# Patient Record
Sex: Male | Born: 1985 | Race: White | Hispanic: No | Marital: Single | Smoking: Current every day smoker
Health system: Southern US, Community
[De-identification: ages and names within clinical notes are randomized; demographics above are authoritative.]

## PROBLEM LIST (undated history)

## (undated) DIAGNOSIS — F909 Attention-deficit hyperactivity disorder, unspecified type: Secondary | ICD-10-CM

## (undated) DIAGNOSIS — R000 Tachycardia, unspecified: Secondary | ICD-10-CM

## (undated) DIAGNOSIS — I10 Essential (primary) hypertension: Secondary | ICD-10-CM

## (undated) DIAGNOSIS — F32A Depression, unspecified: Secondary | ICD-10-CM

## (undated) DIAGNOSIS — J309 Allergic rhinitis, unspecified: Secondary | ICD-10-CM

## (undated) DIAGNOSIS — F329 Major depressive disorder, single episode, unspecified: Secondary | ICD-10-CM

## (undated) HISTORY — DX: Essential (primary) hypertension: I10

## (undated) HISTORY — DX: Depression, unspecified: F32.A

## (undated) HISTORY — DX: Major depressive disorder, single episode, unspecified: F32.9

## (undated) HISTORY — DX: Allergic rhinitis, unspecified: J30.9

## (undated) HISTORY — PX: TONSILLECTOMY: SUR1361

---
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
## 2006-02-23 ENCOUNTER — Ambulatory Visit: Payer: Self-pay | Admitting: Pediatrics

## 2006-07-04 ENCOUNTER — Ambulatory Visit: Payer: Self-pay | Admitting: Pediatrics

## 2006-11-02 ENCOUNTER — Ambulatory Visit: Payer: Self-pay | Admitting: Pediatrics

## 2007-03-02 ENCOUNTER — Ambulatory Visit: Payer: Self-pay | Admitting: Pediatrics

## 2007-07-27 ENCOUNTER — Ambulatory Visit: Payer: Self-pay | Admitting: Pediatrics

## 2008-03-17 ENCOUNTER — Ambulatory Visit: Payer: Self-pay | Admitting: Pediatrics

## 2008-04-08 ENCOUNTER — Ambulatory Visit: Payer: Self-pay | Admitting: Pediatrics

## 2008-07-14 ENCOUNTER — Ambulatory Visit: Payer: Self-pay | Admitting: Pediatrics

## 2008-10-15 ENCOUNTER — Ambulatory Visit: Payer: Self-pay | Admitting: Pediatrics

## 2008-11-10 ENCOUNTER — Ambulatory Visit: Payer: Self-pay | Admitting: Pediatrics

## 2009-02-27 ENCOUNTER — Ambulatory Visit: Payer: Self-pay | Admitting: Pediatrics

## 2009-03-05 ENCOUNTER — Ambulatory Visit: Payer: Self-pay | Admitting: Pediatrics

## 2009-05-19 ENCOUNTER — Ambulatory Visit: Payer: Self-pay | Admitting: Pediatrics

## 2009-08-14 ENCOUNTER — Ambulatory Visit: Payer: Self-pay | Admitting: Pediatrics

## 2009-08-20 ENCOUNTER — Ambulatory Visit: Payer: Self-pay | Admitting: Pediatrics

## 2009-10-23 ENCOUNTER — Ambulatory Visit: Payer: Self-pay | Admitting: Pediatrics

## 2011-05-06 ENCOUNTER — Emergency Department (HOSPITAL_COMMUNITY)
Admission: EM | Admit: 2011-05-06 | Discharge: 2011-05-06 | Disposition: A | Discharge status: Home / Self Care | Payer: Managed Care, Other (non HMO) | Source: Home / Self Care | Attending: Emergency Medicine | Admitting: Emergency Medicine

## 2011-05-06 DIAGNOSIS — J069 Acute upper respiratory infection, unspecified: Secondary | ICD-10-CM

## 2011-05-06 DIAGNOSIS — J029 Acute pharyngitis, unspecified: Secondary | ICD-10-CM

## 2011-05-06 HISTORY — DX: Tachycardia, unspecified: R00.0

## 2011-05-06 HISTORY — DX: Attention-deficit hyperactivity disorder, unspecified type: F90.9

## 2011-05-06 MED ORDER — ACETAMINOPHEN-CODEINE #3 300-30 MG PO TABS: 1.0000 | ORAL_TABLET | Freq: Four times a day (QID) | ORAL | Status: AC | PRN

## 2011-05-06 NOTE — ED Provider Notes (Addendum)
History     CSN: [id number]  Arrival date & time 05/06/11  1417   First MD Initiated Contact with Patient 05/06/11 1529      Chief Complaint  Patient presents with  . Sore Throat  . Cough    (Consider location/radiation/quality/duration/timing/severity/associated sxs/prior treatment) HPI Comments: CONGESTED  AND COUGHING, MORE SO AT NIGHT AND A SORE THROAT, NO FEVERS, NO SOB  Patient is a 25 y.o. male presenting with pharyngitis and cough. The history is provided by the patient.  Sore Throat This is a new problem. The current episode started more than 2 days ago. The problem occurs constantly. Pertinent negatives include no chest pain, no abdominal pain and no shortness of breath. The symptoms are aggravated by swallowing. The symptoms are relieved by nothing.  Cough Associated symptoms include rhinorrhea. Pertinent negatives include no chest pain, no ear pain, no shortness of breath and no wheezing.    Past Medical History  Diagnosis Date  . Attention deficit hyperactivity disorder   . Tachycardia   . Asthma     History reviewed. No pertinent past surgical history.  No family history on file.  History  Substance Use Topics  . Smoking status: Current Some Day Smoker  . Smokeless tobacco: Not on file  . Alcohol Use: No      Review of Systems  Constitutional: Negative for fever, appetite change and fatigue.  HENT: Positive for congestion and rhinorrhea. Negative for ear pain.   Respiratory: Positive for cough. Negative for chest tightness, shortness of breath and wheezing.   Cardiovascular: Negative for chest pain.  Gastrointestinal: Negative for abdominal pain.    Allergies  Review of patient's allergies indicates no known allergies.  Home Medications   Current Outpatient Rx  Name Route Sig Dispense Refill  . AMPHETAMINE-DEXTROAMPHET ER 30 MG PO CP24 Oral Take 30 mg by mouth every morning.      . ACETAMINOPHEN-CODEINE #3 300-30 MG PO TABS Oral Take 1-2  tablets by mouth every 6 (six) hours as needed for pain. 15 tablet 0    BP 153/101  Pulse 103  Temp(Src) 98.7 F (37.1 C) (Oral)  Resp 16  SpO2 100%  Physical Exam  Nursing note and vitals reviewed. Constitutional: He appears well-developed and well-nourished. No distress.  HENT:  Head: Normocephalic.  Right Ear: Tympanic membrane normal.  Left Ear: Tympanic membrane normal.  Nose: Rhinorrhea present.  Mouth/Throat: Uvula is midline, oropharynx is clear and moist and mucous membranes are normal.  Eyes: Conjunctivae are normal.  Neck: No JVD present.  Pulmonary/Chest: Effort normal and breath sounds normal. No respiratory distress. He has no wheezes. He has no rhonchi. He has no rales.  Abdominal: Soft.  Lymphadenopathy:    He has no cervical adenopathy.  Neurological: He is alert.    ED Course  Procedures (including critical care time)   Labs Reviewed  POCT RAPID STREP A ([facility] C[facility] ONLY)   No results found.   1. Upper respiratory infection   2. Pharyngitis       MDM  URI and pharyngitis afebrile sx's for 48 hours        Jimmie Molly, MD 05/06/11 1549  Jimmie Molly, MD 05/06/11 1549

## 2011-05-06 NOTE — ED Notes (Signed)
C/o Sore throat and cough for 2 days.  No known fever but having bodyaches.  Cough keeps him awake at night.

## 2011-05-08 NOTE — ED Notes (Signed)
Pt stated was unable to take tylenol with codeine due to reaction broke out in rash - pt said could take cough med with "hydro"   - tussinex susp 2oz one tsp every 12 hours as needed for cough called to [facility] pt notified -

## 2011-06-02 ENCOUNTER — Encounter (HOSPITAL_COMMUNITY): Payer: Self-pay | Admitting: *Deleted

## 2011-06-02 ENCOUNTER — Emergency Department (HOSPITAL_COMMUNITY)
Admission: EM | Admit: 2011-06-02 | Discharge: 2011-06-02 | Disposition: A | Discharge status: Home / Self Care | Payer: PRIVATE HEALTH INSURANCE | Attending: Emergency Medicine | Admitting: Emergency Medicine

## 2011-06-02 DIAGNOSIS — W010XXA Fall on same level from slipping, tripping and stumbling without subsequent striking against object, initial encounter: Secondary | ICD-10-CM | POA: Insufficient documentation

## 2011-06-02 DIAGNOSIS — F909 Attention-deficit hyperactivity disorder, unspecified type: Secondary | ICD-10-CM | POA: Insufficient documentation

## 2011-06-02 DIAGNOSIS — J45909 Unspecified asthma, uncomplicated: Secondary | ICD-10-CM | POA: Insufficient documentation

## 2011-06-02 DIAGNOSIS — S335XXA Sprain of ligaments of lumbar spine, initial encounter: Secondary | ICD-10-CM | POA: Insufficient documentation

## 2011-06-02 DIAGNOSIS — M545 Low back pain, unspecified: Secondary | ICD-10-CM | POA: Insufficient documentation

## 2011-06-02 DIAGNOSIS — S39012A Strain of muscle, fascia and tendon of lower back, initial encounter: Secondary | ICD-10-CM

## 2011-06-02 HISTORY — DX: Attention-deficit hyperactivity disorder, unspecified type: F90.9

## 2011-06-02 MED ORDER — CYCLOBENZAPRINE HCL 10 MG PO TABS: 10.0000 mg | ORAL_TABLET | Freq: Two times a day (BID) | ORAL | Status: AC | PRN

## 2011-06-02 MED ORDER — TRAMADOL HCL 50 MG PO TABS: 50.0000 mg | ORAL_TABLET | Freq: Four times a day (QID) | ORAL | Status: AC | PRN

## 2011-06-02 NOTE — ED Notes (Addendum)
C/o back pain, slipped on ice/snow at ~ 2040 while getting back into car, denies fall, mentions pulled muscle in back a few days ago. Unsure of radiation, denies leg pain, having a difficult time describing pain. Pain worse with turning/twisting. "feels muscular". Pt was driving a [facility] vehicle. "registerred as a W/c injury. Report filed".

## 2011-06-04 NOTE — ED Provider Notes (Signed)
History     CSN: [id number]  Arrival date & time 06/02/11  2119   First MD Initiated Contact with Patient 06/02/11 2156      Chief Complaint  Patient presents with  . Back Pain    (Consider location/radiation/quality/duration/timing/severity/associated sxs/prior treatment) HPI Comments: Patient here complaining of lower back pain after slipping on the ice and falling partially into his car, states that he "pulled a muscle in his back" 2 days ago and he thinks that he has exacerbated this.  Denies numbness, tingling, loss of control of bowels or bladder, weakness, or radiation of the pain.  Patient is a 26 y.o. male presenting with back pain. The history is provided by the patient. No language interpreter was used.  Back Pain  This is a new problem. The current episode started 1 to 2 hours ago. The problem occurs constantly. The problem has not changed since onset.The pain is associated with falling. The pain is present in the lumbar spine. The quality of the pain is described as stabbing. The pain is at a severity of 5/10. The pain is moderate. The symptoms are aggravated by bending and twisting. The pain is the same all the time. Pertinent negatives include no chest pain, no fever, no numbness, no weight loss, no headaches, no abdominal pain, no abdominal swelling, no bowel incontinence, no perianal numbness, no bladder incontinence, no dysuria, no pelvic pain, no leg pain, no paresthesias, no paresis, no tingling and no weakness. He has tried nothing for the symptoms. The treatment provided no relief.    Past Medical History  Diagnosis Date  . ADHD (attention deficit hyperactivity disorder)   . Asthma   . Tachycardia     Past Surgical History  Procedure Date  . Tonsillectomy     Family History  Problem Relation Age of Onset  . Heart failure Other   . Diabetes Other   . Hypertension Other     History  Substance Use Topics  . Smoking status: Current Everyday Smoker -- 0.5  packs/day  . Smokeless tobacco: Not on file  . Alcohol Use: Yes     rare      Review of Systems  Constitutional: Negative for fever and weight loss.  Cardiovascular: Negative for chest pain.  Gastrointestinal: Negative for abdominal pain and bowel incontinence.  Genitourinary: Negative for bladder incontinence, dysuria and pelvic pain.  Musculoskeletal: Positive for back pain.  Neurological: Negative for tingling, weakness, numbness, headaches and paresthesias.  All other systems reviewed and are negative.    Allergies  Codeine  Home Medications   Current Outpatient Rx  Name Route Sig Dispense Refill  . LISDEXAMFETAMINE DIMESYLATE 40 MG PO CAPS Oral Take 40 mg by mouth 2 (two) times daily.    . CYCLOBENZAPRINE HCL 10 MG PO TABS Oral Take 1 tablet (10 mg total) by mouth 2 (two) times daily as needed for muscle spasms. 20 tablet 0  . TRAMADOL HCL 50 MG PO TABS Oral Take 1 tablet (50 mg total) by mouth every 6 (six) hours as needed for pain. 15 tablet 0    BP 126/99  Pulse 105  Temp(Src) 98 F (36.7 C) (Oral)  Resp 20  SpO2 98%  Physical Exam  Nursing note and vitals reviewed. Constitutional: He is oriented to person, place, and time. He appears well-developed and well-nourished. No distress.  HENT:  Head: Normocephalic and atraumatic.  Right Ear: External ear normal.  Left Ear: External ear normal.  Nose: Nose normal.  Mouth/Throat: Oropharynx  is clear and moist. No oropharyngeal exudate.  Eyes: Conjunctivae are normal. Pupils are equal, round, and reactive to light. No scleral icterus.  Neck: Normal range of motion. Neck supple.  Cardiovascular: Normal rate, regular rhythm and normal heart sounds.  Exam reveals no gallop and no friction rub.   No murmur heard. Pulmonary/Chest: Effort normal and breath sounds normal. He exhibits no tenderness.  Abdominal: Soft. Bowel sounds are normal. He exhibits no distension. There is no tenderness.  Musculoskeletal:        Lumbar back: He exhibits tenderness and spasm. He exhibits normal range of motion and no bony tenderness.  Lymphadenopathy:    He has no cervical adenopathy.  Neurological: He is alert and oriented to person, place, and time. No cranial nerve deficit.  Skin: Skin is warm and dry.  Psychiatric: He has a normal mood and affect. His behavior is normal. Judgment and thought content normal.    ED Course  Procedures (including critical care time)  Labs Reviewed - No data to display No results found.   1. Lumbar strain       MDM  Patient with pain with movement and palpation of the paraspinal muscles - no midline tenderness - I do not feel that x-rays are needed at this time.         Izola Price [place], [place] 06/04/11 2205

## 2011-06-06 NOTE — ED Provider Notes (Signed)
Medical screening examination/treatment/procedure(s) were performed by non-physician practitioner and as supervising physician I was immediately available for consultation/collaboration.  Loren Racer, MD 06/06/11 [phone]

## 2011-08-05 ENCOUNTER — Encounter (HOSPITAL_COMMUNITY): Payer: Self-pay

## 2011-08-05 ENCOUNTER — Emergency Department (INDEPENDENT_AMBULATORY_CARE_PROVIDER_SITE_OTHER)
Admission: EM | Admit: 2011-08-05 | Discharge: 2011-08-05 | Disposition: A | Discharge status: Home / Self Care | Payer: Managed Care, Other (non HMO) | Source: Home / Self Care | Attending: Emergency Medicine | Admitting: Emergency Medicine

## 2011-08-05 DIAGNOSIS — J45909 Unspecified asthma, uncomplicated: Secondary | ICD-10-CM

## 2011-08-05 DIAGNOSIS — J209 Acute bronchitis, unspecified: Secondary | ICD-10-CM

## 2011-08-05 DIAGNOSIS — J019 Acute sinusitis, unspecified: Secondary | ICD-10-CM

## 2011-08-05 MED ORDER — PREDNISONE 5 MG PO KIT: 1.0000 | PACK | Freq: Every day | ORAL | Status: DC

## 2011-08-05 MED ORDER — HYDROCOD POLST-CHLORPHEN POLST 10-8 MG/5ML PO LQCR: 5.0000 mL | Freq: Two times a day (BID) | ORAL | Status: DC | PRN

## 2011-08-05 MED ORDER — AMOXICILLIN-POT CLAVULANATE 875-125 MG PO TABS: 1.0000 | ORAL_TABLET | Freq: Two times a day (BID) | ORAL | Status: AC

## 2011-08-05 NOTE — ED Notes (Signed)
C/o sick for past couple of days; productive cough w green secretions, HA, body aches; wheezing noted bilateral on ascultation; hist of asthma , using advair and albuterol MDI; states he has been evaluated by a cardiologist and had over night studies for his hypertension and tachycardia; NAD at present; wishes to wait until MD ready to see before he occupies a room

## 2011-08-05 NOTE — Discharge Instructions (Signed)

## 2011-08-05 NOTE — ED Provider Notes (Signed)
Chief Complaint  Patient presents with  . URI    History of Present Illness:   Ethan Forbes is a 26 year old male who has had a three-day history of cough productive of green sputum, chest congestion, wheezing, headache, nasal congestion with green rhinorrhea, ear congestion, abdominal pain, vomiting, and myalgias. He's also had a sore throat. He has a history of asthma which is controlled with Advair and as needed albuterol.  Review of Systems:  Other than noted above, the patient denies any of the following symptoms. Systemic:  No fever, chills, sweats, fatigue, myalgias, headache, or anorexia. Eye:  No redness, pain or drainage. ENT:  No earache, nasal congestion, rhinorrhea, sinus pressure, or sore throat. Lungs:  No cough, sputum production, wheezing, shortness of breath. Or chest pain. GI:  No nausea, vomiting, abdominal pain or diarrhea. Skin:  No rash or itching.  PMFSH:  Past medical history, family history, social history, meds, and allergies were reviewed.  Physical Exam:   Vital signs:  BP 147/104  Pulse 119  Temp(Src) 98.7 F (37.1 C) (Oral)  Resp 10  SpO2 100% General:  Alert, in no distress. Eye:  No conjunctival injection or drainage. ENT:  TMs and canals were normal, without erythema or inflammation.  Nasal mucosa was clear and uncongested, without drainage.  Mucous membranes were moist.  Pharynx was clear, without exudate or drainage.  There were no oral ulcerations or lesions. Neck:  Supple, no adenopathy, tenderness or mass. Lungs:  No respiratory distress. He has scattered, bilateral expiratory wheezes, no rales or rhonchi.  Breath sounds were otherwise clear and equal bilaterally. Heart:  Regular rhythm, without gallops, murmers or rubs. Skin:  Clear, warm, and dry, without rash or lesions.  Assessment:   Diagnoses that have been ruled out:  None  Diagnoses that are still under consideration:  None  Final diagnoses:  Acute bronchitis  Acute sinusitis  Asthma      Plan:   1.  The following meds were prescribed:   New Prescriptions   AMOXICILLIN-CLAVULANATE (AUGMENTIN) 875-125 MG PER TABLET    Take 1 tablet by mouth 2 (two) times daily.   CHLORPHENIRAMINE-HYDROCODONE (TUSSIONEX) 10-8 MG/5ML LQCR    Take 5 mLs by mouth every 12 (twelve) hours as needed.   PREDNISONE 5 MG KIT    Take 1 kit (5 mg total) by mouth daily after breakfast. Prednisone 5 mg 6 day dosepack.  Take as directed.   2.  The patient was instructed in symptomatic care and handouts were given. 3.  The patient was told to return if becoming worse in any way, if no better in 3 or 4 days, and given some red flag symptoms that would indicate earlier return.   Reuben Likes, MD 08/05/11 2025

## 2011-08-09 ENCOUNTER — Encounter (HOSPITAL_COMMUNITY): Payer: Self-pay | Admitting: Emergency Medicine

## 2011-08-09 ENCOUNTER — Emergency Department (HOSPITAL_COMMUNITY)
Admission: EM | Admit: 2011-08-09 | Discharge: 2011-08-10 | Disposition: A | Discharge status: Home / Self Care | Payer: PRIVATE HEALTH INSURANCE | Attending: Emergency Medicine | Admitting: Emergency Medicine

## 2011-08-09 DIAGNOSIS — J45909 Unspecified asthma, uncomplicated: Secondary | ICD-10-CM | POA: Insufficient documentation

## 2011-08-09 DIAGNOSIS — W010XXA Fall on same level from slipping, tripping and stumbling without subsequent striking against object, initial encounter: Secondary | ICD-10-CM | POA: Insufficient documentation

## 2011-08-09 DIAGNOSIS — F909 Attention-deficit hyperactivity disorder, unspecified type: Secondary | ICD-10-CM | POA: Insufficient documentation

## 2011-08-09 DIAGNOSIS — S239XXA Sprain of unspecified parts of thorax, initial encounter: Secondary | ICD-10-CM | POA: Insufficient documentation

## 2011-08-09 DIAGNOSIS — S335XXA Sprain of ligaments of lumbar spine, initial encounter: Secondary | ICD-10-CM | POA: Insufficient documentation

## 2011-08-09 DIAGNOSIS — S39012A Strain of muscle, fascia and tendon of lower back, initial encounter: Secondary | ICD-10-CM

## 2011-08-09 DIAGNOSIS — F17200 Nicotine dependence, unspecified, uncomplicated: Secondary | ICD-10-CM | POA: Insufficient documentation

## 2011-08-09 DIAGNOSIS — Y9210 Unspecified residential institution as the place of occurrence of the external cause: Secondary | ICD-10-CM | POA: Insufficient documentation

## 2011-08-09 DIAGNOSIS — Z885 Allergy status to narcotic agent status: Secondary | ICD-10-CM | POA: Insufficient documentation

## 2011-08-09 MED ORDER — METHOCARBAMOL 500 MG PO TABS: 500.0000 mg | ORAL_TABLET | Freq: Two times a day (BID) | ORAL | Status: AC

## 2011-08-09 MED ORDER — HYDROCODONE-ACETAMINOPHEN 5-325 MG PO TABS: 1.0000 | ORAL_TABLET | ORAL | Status: AC | PRN

## 2011-08-09 NOTE — ED Notes (Signed)
PT. NEARLY LOST BALANCED AND ALMOST FELL WHILE GETTING UPSTAIRS THIS EVENING WHILE TRYING TO BRING FOOD UPSTAIRS. PT. WORKS HERE AT REGISTRATION .

## 2011-08-09 NOTE — ED Provider Notes (Signed)
History     CSN: [id number]  Arrival date & time 08/09/11  2229   First MD Initiated Contact with Patient 08/09/11 2302      Chief Complaint  Patient presents with  . Back Pain    HPI  History provided by the patient. Patient is a 26 year old male with history of ADHD, asthma presents with complaints of back strain and soreness that began earlier today. Patient states he was at work and was carrying a heavy tray from [employer] when he stumbled and lost his balance going up the stairs. Patient did not fall to the ground. Patient reports having persistent and slightly increasing mid and low back pains. Pain is worse with bending forward. Patient reports mild pains with other bending movements of the back. Patient did take ibuprofen without significant relief. Pain does not radiate. Pain is described as moderate to severe. Patient denies any aggravating or alleviating factors.    Past Medical History  Diagnosis Date  . ADHD (attention deficit hyperactivity disorder)   . Asthma   . Tachycardia     Past Surgical History  Procedure Date  . Tonsillectomy     Family History  Problem Relation Age of Onset  . Heart failure Other   . Diabetes Other   . Hypertension Other     History  Substance Use Topics  . Smoking status: Current Everyday Smoker -- 0.5 packs/day  . Smokeless tobacco: Not on file  . Alcohol Use: Yes     rare      Review of Systems  Respiratory: Negative for shortness of breath.   Cardiovascular: Negative for chest pain.  Gastrointestinal: Negative for nausea and vomiting.  Musculoskeletal: Positive for back pain.  Skin: Negative for rash.  Neurological: Negative for weakness and numbness.    Allergies  Codeine  Home Medications   Current Outpatient Rx  Name Route Sig Dispense Refill  . LISDEXAMFETAMINE DIMESYLATE 40 MG PO CAPS Oral Take 40 mg by mouth 2 (two) times daily.      There were no vitals taken for this visit.  Physical Exam    Nursing note and vitals reviewed. Constitutional: He is oriented to person, place, and time. He appears well-developed and well-nourished. No distress.  HENT:  Head: Normocephalic.  Cardiovascular: Normal rate and regular rhythm.   Pulmonary/Chest: Effort normal and breath sounds normal.  Musculoskeletal: He exhibits no edema and no tenderness.       Cervical back: Normal.       Thoracic back: He exhibits no bony tenderness.       Lumbar back: Normal.       Back:  Neurological: He is alert and oriented to person, place, and time. He has normal strength. No sensory deficit. Gait normal.  Skin: Skin is warm. No rash noted.  Psychiatric: He has a normal mood and affect. His behavior is normal.    ED Course  Procedures      1. Back strain       MDM  1:45 PM patient seen and evaluated. Patient in no acute distress.        Angus Seller, [place] 08/11/11 [phone]

## 2011-08-09 NOTE — ED Notes (Signed)
Pt states pain to lower back that increases when he leans forward.  States he feels NO pain while sitting. No numbness, tingling.

## 2011-08-09 NOTE — Discharge Instructions (Signed)
Please followup with her primary care provider for continued evaluation and treatment of your symptoms. It is recommended that you have light duty for the next 3-5 days to help with your symptoms. Return to emergency room for any worsening or severe pain, numbness weakness in lower extremities, loss of control of her bowels or bladder.   Back Pain, Adult Low back pain is very common. About 1 in 5 people have back pain.The cause of low back pain is rarely dangerous. The pain often gets better over time.About half of people with a sudden onset of back pain feel better in just 2 weeks. About 8 in 10 people feel better by 6 weeks.  CAUSES Some common causes of back pain include:  Strain of the muscles or ligaments supporting the spine.   Wear and tear (degeneration) of the spinal discs.   Arthritis.   Direct injury to the back.  DIAGNOSIS Most of the time, the direct cause of low back pain is not known.However, back pain can be treated effectively even when the exact cause of the pain is unknown.Answering your caregiver's questions about your overall health and symptoms is one of the most accurate ways to make sure the cause of your pain is not dangerous. If your caregiver needs more information, he or she may order lab work or imaging tests (X-rays or MRIs).However, even if imaging tests show changes in your back, this usually does not require surgery. HOME CARE INSTRUCTIONS For many people, back pain returns.Since low back pain is rarely dangerous, it is often a condition that people can learn to [facility] their own.   Remain active. It is stressful on the back to sit or stand in one place. Do not sit, drive, or stand in one place for more than 30 minutes at a time. Take short walks on level surfaces as soon as pain allows.Try to increase the length of time you walk each day.   Do not stay in bed.Resting more than 1 or 2 days can delay your recovery.   Do not avoid exercise or  work.Your body is made to move.It is not dangerous to be active, even though your back may hurt.Your back will likely heal faster if you return to being active before your pain is gone.   Pay attention to your body when you bend and lift. Many people have less discomfortwhen lifting if they bend their knees, keep the load close to their bodies,and avoid twisting. Often, the most comfortable positions are those that put less stress on your recovering back.   Find a comfortable position to sleep. Use a firm mattress and lie on your side with your knees slightly bent. If you lie on your back, put a pillow under your knees.   Only take over-the-counter or prescription medicines as directed by your caregiver. Over-the-counter medicines to reduce pain and inflammation are often the most helpful.Your caregiver may prescribe muscle relaxant drugs.These medicines help dull your pain so you can more quickly return to your normal activities and healthy exercise.   Put ice on the injured area.   Put ice in a plastic bag.   Place a towel between your skin and the bag.   Leave the ice on for 15 to 20 minutes, 3 to 4 times a day for the first 2 to 3 days. After that, ice and heat may be alternated to reduce pain and spasms.   Ask your caregiver about trying back exercises and gentle massage. This may be of  some benefit.   Avoid feeling anxious or stressed.Stress increases muscle tension and can worsen back pain.It is important to recognize when you are anxious or stressed and learn ways to manage it.Exercise is a great option.  SEEK MEDICAL CARE IF:  You have pain that is not relieved with rest or medicine.   You have pain that does not improve in 1 week.   You have new symptoms.   You are generally not feeling well.  SEEK IMMEDIATE MEDICAL CARE IF:   You have pain that radiates from your back into your legs.   You develop new bowel or bladder control problems.   You have unusual  weakness or numbness in your arms or legs.   You develop nausea or vomiting.   You develop abdominal pain.   You feel faint.  Document Released: 05/02/2005 Document Revised: 04/21/2011 Document Reviewed: 09/20/2010 [facility] Patient Information 2012 [place], [place].     Back Exercises Back exercises help treat and prevent back injuries. The goal of back exercises is to increase the strength of your abdominal and back muscles and the flexibility of your back. These exercises should be started when you no longer have back pain. Back exercises include:  Pelvic Tilt. Lie on your back with your knees bent. Tilt your pelvis until the lower part of your back is against the floor. Hold this position 5 to 10 sec and repeat 5 to 10 times.   Knee to Chest. Pull first 1 knee up against your chest and hold for 20 to 30 seconds, repeat this with the other knee, and then both knees. This may be done with the other leg straight or bent, whichever feels better.   Sit-Ups or Curl-Ups. Bend your knees 90 degrees. Start with tilting your pelvis, and do a partial, slow sit-up, lifting your trunk only 30 to 45 degrees off the floor. Take at least 2 to 3 seconds for each sit-up. Do not do sit-ups with your knees out straight. If partial sit-ups are difficult, simply do the above but with only tightening your abdominal muscles and holding it as directed.   Hip-Lift. Lie on your back with your knees flexed 90 degrees. Push down with your feet and shoulders as you raise your hips a couple inches off the floor; hold for 10 seconds, repeat 5 to 10 times.   Back arches. Lie on your stomach, propping yourself up on bent elbows. Slowly press on your hands, causing an arch in your low back. Repeat 3 to 5 times. Any initial stiffness and discomfort should lessen with repetition over time.   Shoulder-Lifts. Lie face down with arms beside your body. Keep hips and torso pressed to floor as you slowly lift your head and  shoulders off the floor.  Do not overdo your exercises, especially in the beginning. Exercises may cause you some mild back discomfort which lasts for a few minutes; however, if the pain is more severe, or lasts for more than 15 minutes, do not continue exercises until you see your caregiver. Improvement with exercise therapy for back problems is slow.  See your caregivers for assistance with developing a proper back exercise program. Document Released: 06/09/2004 Document Revised: 04/21/2011 Document Reviewed: 05/02/2005 [facility] Patient Information 2012 [place], [place].   RESOURCE GUIDE  Dental Problems  Patients with Medicaid: [facility] [phone] [address]  [address] Phone:  [phone]                                                  Phone:  [phone]  If unable to pay or uninsured, contact:  [facility] or [facility]. to become qualified for the adult dental clinic.  Chronic Pain Problems Contact [facility]  [phone] Patients need to be referred by their primary care doctor.  Insufficient Money for Medicine Contact [employer] Way:  call "211" or [facility] 939-327-9293.  No Primary Care Doctor Call [facility]  [phone] Other agencies that provide inexpensive medical care    [facility]  [phone]    [facility]  [phone]    [facility]  [phone]    [facility]  [phone]    [facility]  [phone]    [facility]  [phone]  Psychological Services [facility] Health  [phone] [facility]  [phone] [facility] Mental Health   [phone] (emergency services 7473560365)  Substance Abuse Resources [facility]  989-730-2535 [facility] [phone] [facility] [phone] Floydene Flock [phone] Residential &  Outpatient Substance Abuse Program  [phone]  Abuse/Neglect [facility] Child Abuse Hotline 587-103-9889 [facility] Services Child Abuse Hotline 267-373-9345 (After Hours)  Emergency Shelter [facility] 215-168-8120  Maternity Homes [facility] 720-226-8954 [facility] 320-008-3066  MRSA Hotline #:   512-841-9331    [facility] Resources  [facility]     [employer]                          [facility] Dept. [address] [place]                       [address]      [address]  Blondell Reveal Phone:  [phone]                                   Phone:  [phone]                 Phone:  [phone]  [facility] Mental Health Phone:  [phone]  [facility] Child Abuse Hotline 2060480430 [phone] (After Hours)

## 2011-08-11 NOTE — ED Provider Notes (Signed)
Medical screening examination/treatment/procedure(s) were performed by non-physician practitioner and as supervising physician I was immediately available for consultation/collaboration.   Gavin Pound. Oletta Lamas, MD 08/11/11 [phone]

## 2011-08-29 ENCOUNTER — Encounter (HOSPITAL_COMMUNITY): Payer: Self-pay | Admitting: Emergency Medicine

## 2011-09-04 ENCOUNTER — Emergency Department (INDEPENDENT_AMBULATORY_CARE_PROVIDER_SITE_OTHER): Payer: Managed Care, Other (non HMO)

## 2011-09-04 ENCOUNTER — Encounter (HOSPITAL_COMMUNITY): Payer: Self-pay

## 2011-09-04 ENCOUNTER — Emergency Department (INDEPENDENT_AMBULATORY_CARE_PROVIDER_SITE_OTHER)
Admission: EM | Admit: 2011-09-04 | Discharge: 2011-09-04 | Disposition: A | Discharge status: Home / Self Care | Payer: Managed Care, Other (non HMO) | Source: Home / Self Care | Attending: Emergency Medicine | Admitting: Emergency Medicine

## 2011-09-04 DIAGNOSIS — J45909 Unspecified asthma, uncomplicated: Secondary | ICD-10-CM

## 2011-09-04 DIAGNOSIS — J019 Acute sinusitis, unspecified: Secondary | ICD-10-CM

## 2011-09-04 DIAGNOSIS — J309 Allergic rhinitis, unspecified: Secondary | ICD-10-CM

## 2011-09-04 MED ORDER — MOXIFLOXACIN HCL 400 MG PO TABS: 400.0000 mg | ORAL_TABLET | Freq: Every day | ORAL | Status: AC

## 2011-09-04 MED ORDER — HYDROCOD POLST-CHLORPHEN POLST 10-8 MG/5ML PO LQCR: 5.0000 mL | Freq: Two times a day (BID) | ORAL | Status: DC | PRN

## 2011-09-04 NOTE — Discharge Instructions (Signed)

## 2011-09-04 NOTE — ED Notes (Signed)
Pt has sinus congestion and cough for two days.

## 2011-09-04 NOTE — ED Provider Notes (Signed)
Chief Complaint  Patient presents with  . Sinus Problem    History of Present Illness:   I saw Ethan Forbes a month ago with similar symptoms with nasal congestion, cough, wheezing, and aching in his chest. He completed a course of Augmentin and prednisone and got a little bit better. His symptoms never completely went away and they've flared up again over the past week. At this point he describes nasal congestion with greenish drainage, sinus pressure, headache, fullness of the ears, and itchy, red eyes. He also has sore throat, aching in his chest, cough productive of greenish sputum which is at times bloody, and wheezing. He denies any fever chills. He's been taking Advair 50/250, albuterol, Tessalon, NyQuil, and asked appropriate. His cough is worse at nighttime. He has a history of asthma and allergies, he has had allergy testing but never taken allergy shots.  Review of Systems:  Other than noted above, the patient denies any of the following symptoms. Systemic:  No fever, chills, sweats, fatigue, myalgias, headache, or anorexia. Eye:  No redness,pain or drainage. ENT:  No earache, ear congestion, nasal congestion, sneezing, rhinorrhea, sinus pressure, sinus pain, post nasal drip, or sore throat. Lungs:  No cough, sputum production, wheezing, shortness of breath. Or chest pain. Skin:  No rash or itching.  PMFSH:  Past medical history, family history, social history, meds, and allergies were reviewed.  Physical Exam:   Vital signs:  BP 143/103  Pulse 120  Temp(Src) 98.3 F (36.8 C) (Oral)  Resp 20  SpO2 99% General:  Alert, in no distress. Eye:  No conjunctival injection or drainage. Lids were normal. ENT:  TMs and canals were normal, without erythema or inflammation.  Nasal mucosa was clear and uncongested, without drainage.  Mucous membranes were moist.  Pharynx was clear, without exudate or drainage.  There were no oral ulcerations or lesions. Neck:  Supple, no adenopathy, tenderness or  mass. Lungs:  No respiratory distress.  Lungs were clear to auscultation, without wheezes, rales or rhonchi.  Breath sounds were clear and equal bilaterally. Heart:  Regular rhythm, without gallops, murmers or rubs. Skin:  Clear, warm, and dry, without rash or lesions.  Radiology:  Dg Chest 2 View  09/04/2011  *RADIOLOGY REPORT*  Clinical Data: Cough for 1 month.  CHEST - 2 VIEW  Comparison: None.  Findings: Lungs are clear.  No pneumothorax or pleural fluid. Heart size is normal.  IMPRESSION: Negative chest.  Original Report Authenticated By: Bernadene Bell. Maricela Curet, M.D.    Assessment:  The primary encounter diagnosis was Allergic rhinitis. Diagnoses of Acute sinus infection and Asthma were also pertinent to this visit.  Plan:   1.  The following meds were prescribed:   New Prescriptions   CHLORPHENIRAMINE-HYDROCODONE (TUSSIONEX) 10-8 MG/5ML LQCR    Take 5 mLs by mouth every 12 (twelve) hours as needed.   MOXIFLOXACIN (AVELOX) 400 MG TABLET    Take 1 tablet (400 mg total) by mouth daily.   2.  The patient was instructed in symptomatic care and handouts were given. 3.  The patient was told to return if becoming worse in any way, if no better in 3 or 4 days, and given some red flag symptoms that would indicate earlier return.  Follow up:  The patient was told to follow up with Dr. Wyatt Callas within the next week or 2.    Ethan Likes, MD 09/04/11 [phone]

## 2011-10-08 ENCOUNTER — Emergency Department (HOSPITAL_COMMUNITY)
Admission: EM | Admit: 2011-10-08 | Discharge: 2011-10-08 | Disposition: A | Discharge status: Home / Self Care | Payer: PRIVATE HEALTH INSURANCE | Attending: Emergency Medicine | Admitting: Emergency Medicine

## 2011-10-08 DIAGNOSIS — X500XXA Overexertion from strenuous movement or load, initial encounter: Secondary | ICD-10-CM | POA: Insufficient documentation

## 2011-10-08 DIAGNOSIS — S335XXA Sprain of ligaments of lumbar spine, initial encounter: Secondary | ICD-10-CM | POA: Insufficient documentation

## 2011-10-08 DIAGNOSIS — S39012A Strain of muscle, fascia and tendon of lower back, initial encounter: Secondary | ICD-10-CM

## 2011-10-08 DIAGNOSIS — J45909 Unspecified asthma, uncomplicated: Secondary | ICD-10-CM | POA: Insufficient documentation

## 2011-10-08 DIAGNOSIS — F909 Attention-deficit hyperactivity disorder, unspecified type: Secondary | ICD-10-CM | POA: Insufficient documentation

## 2011-10-08 MED ORDER — CYCLOBENZAPRINE HCL 10 MG PO TABS: 10.0000 mg | ORAL_TABLET | Freq: Two times a day (BID) | ORAL | Status: AC | PRN

## 2011-10-08 MED ORDER — HYDROCODONE-ACETAMINOPHEN 5-325 MG PO TABS: 1.0000 | ORAL_TABLET | Freq: Four times a day (QID) | ORAL | Status: AC | PRN

## 2011-10-08 NOTE — ED Notes (Signed)
Pt c/o back pain that began 2 days ago after attempting to lift heavy pt while at work. Pt states pain starts middle of his back and radiates down to lower back. Pt states he is unable to stand straight up due to pain.

## 2011-10-08 NOTE — ED Provider Notes (Signed)
History     CSN: [id number]  Arrival date & time 10/08/11  2022   First MD Initiated Contact with Patient 10/08/11 2133      Chief Complaint  Patient presents with  . Back Pain    (Consider location/radiation/quality/duration/timing/severity/associated sxs/prior treatment) HPI  26 year old male presents complaining of back pain. Patient states 2 days ago he was attempt to lift a wheel chair when he felt a pulling sensation which started from the middle his back and radiates down to his low back. Described pain as a throbbing sensation worsening when he strained his back. Onset is acute, persistent, pain is worse at nighttime and is unable to sleep. He denies fever, rash, abdominal pain, urinary symptoms, urinary or bowel incontinence, or caudal equina symptoms.  Past Medical History  Diagnosis Date  . Attention deficit hyperactivity disorder   . ADHD (attention deficit hyperactivity disorder)   . Asthma   . Tachycardia     Past Surgical History  Procedure Date  . Tonsillectomy     Family History  Problem Relation Age of Onset  . Heart failure Other   . Diabetes Other   . Hypertension Other     History  Substance Use Topics  . Smoking status: Current Everyday Smoker -- 0.5 packs/day  . Smokeless tobacco: Not on file  . Alcohol Use: Yes     rare      Review of Systems  All other systems reviewed and are negative.    Allergies  Codeine and Robaxin  Home Medications   Current Outpatient Rx  Name Route Sig Dispense Refill  . ACETAMINOPHEN 500 MG PO TABS Oral Take 1,000 mg by mouth every 6 (six) hours as needed. pain    . IBUPROFEN 200 MG PO TABS Oral Take 400 mg by mouth every 6 (six) hours as needed. pain    . LISDEXAMFETAMINE DIMESYLATE 40 MG PO CAPS Oral Take 40 mg by mouth 2 (two) times daily.      BP 129/92  Pulse 116  Temp(Src) 97.7 F (36.5 C) (Oral)  Resp 18  Physical Exam  Nursing note and vitals reviewed. Constitutional: He appears  well-developed and well-nourished. No distress.  HENT:  Head: Atraumatic.  Eyes: Conjunctivae are normal.  Neck: Normal range of motion. Neck supple.  Cardiovascular:       Tachycardia: baseline  Abdominal: There is no tenderness. There is no CVA tenderness.  Musculoskeletal: Normal range of motion. He exhibits no edema.       Cervical back: Normal.       Thoracic back: Normal.       Lumbar back: Normal.       Back:  Neurological: He has normal strength. No sensory deficit.  Reflex Scores:      Patellar reflexes are 2+ on the right side and 2+ on the left side.   ED Course  Procedures (including critical care time)  Labs Reviewed - No data to display No results found.   No diagnosis found.    MDM  Pain consistent with muscle strain.  Xray not warranted at this time.  Will d/c with norco and flexeril.  Prior chart reviewed.  Pt seen 2 times prior for similar complaint.  Pt has tachycardia noted on vital sign, this is baseline.   9:50 PM Pt sts he has taking norco in the past without allergic rxn.       Fayrene Helper, PA-C 10/08/11 2148  Fayrene Helper, PA-C 10/08/11 2150

## 2011-10-08 NOTE — ED Provider Notes (Signed)
Medical screening examination/treatment/procedure(s) were performed by non-physician practitioner and as supervising physician I was immediately available for consultation/collaboration.   Dayton Bailiff, MD 10/08/11 2154

## 2011-10-08 NOTE — Discharge Instructions (Signed)

## 2011-10-08 NOTE — ED Notes (Signed)
Pt c/o upper mid and low bilat back pain. Pt states he was attempting to move pt from wheelchair 2 days ago, pain progressively worse over night, pain worse with lying down and sitting up straight

## 2011-10-29 ENCOUNTER — Emergency Department (INDEPENDENT_AMBULATORY_CARE_PROVIDER_SITE_OTHER)
Admission: EM | Admit: 2011-10-29 | Discharge: 2011-10-29 | Disposition: A | Discharge status: Home / Self Care | Payer: Managed Care, Other (non HMO) | Source: Home / Self Care | Attending: Emergency Medicine | Admitting: Emergency Medicine

## 2011-10-29 ENCOUNTER — Encounter (HOSPITAL_COMMUNITY): Payer: Self-pay

## 2011-10-29 DIAGNOSIS — J019 Acute sinusitis, unspecified: Secondary | ICD-10-CM

## 2011-10-29 DIAGNOSIS — J029 Acute pharyngitis, unspecified: Secondary | ICD-10-CM

## 2011-10-29 LAB — POCT RAPID STREP A: Streptococcus, Group A Screen (Direct): NEGATIVE

## 2011-10-29 MED ORDER — FLUTICASONE PROPIONATE 50 MCG/ACT NA SUSP: 2.0000 | Freq: Every day | NASAL | Status: DC

## 2011-10-29 MED ORDER — AMOXICILLIN-POT CLAVULANATE 875-125 MG PO TABS: 1.0000 | ORAL_TABLET | Freq: Two times a day (BID) | ORAL | Status: AC

## 2011-10-29 NOTE — Discharge Instructions (Signed)
Sinusitis Sinuses are air pockets within the bones of your face. The growth of bacteria within a sinus leads to infection. The infection prevents the sinuses from draining. This infection is called sinusitis. SYMPTOMS  There will be different areas of pain depending on which sinuses have become infected.  The maxillary sinuses often produce pain beneath the eyes.   Frontal sinusitis may cause pain in the middle of the forehead and above the eyes.  Other problems (symptoms) include: Toothaches.     Pharyngitis, Viral and Bacterial Pharyngitis is soreness (inflammation) or infection of the pharynx. It is also called a sore throat. CAUSES  Most sore throats are caused by viruses and are part of a cold. However, some sore throats are caused by strep and other bacteria. Sore throats can also be caused by post nasal drip from draining sinuses, allergies and sometimes from sleeping with an open mouth. Infectious sore throats can be spread from person to person by coughing, sneezing and sharing cups or eating utensils. TREATMENT  Sore throats that are viral usually last 3-4 days. Viral illness will get better without medications (antibiotics). Strep throat and other bacterial infections will usually begin to get better about 24-48 hours after you begin to take antibiotics. HOME CARE INSTRUCTIONS  If the caregiver feels there is a bacterial infection or if there is a positive strep test, they will prescribe an antibiotic. The full course of antibiotics must be taken. If the full course of antibiotic is not taken, you or your child may become ill again. If you or your child has strep throat and do not finish all of the medication, serious heart or kidney diseases may develop.  Drink enough water and fluids to keep your urine clear or pale yellow.  Only take over-the-counter or prescription medicines for pain, discomfort or fever as directed by your caregiver.  Get lots of rest.  Gargle with salt water  ( tsp. of salt in a glass of water) as often as every 1-2 hours as you need for comfort.  Hard candies may soothe the throat if individual is not at risk for choking. Throat sprays or lozenges may also be used.  SEEK MEDICAL CARE IF:  Large, tender lumps in the neck develop.  A rash develops.  Green, yellow-brown or bloody sputum is coughed up.  Your baby is older than 3 months with a rectal temperature of 100.5 F (38.1 C) or higher for more than 1 day.  SEEK IMMEDIATE MEDICAL CARE IF:  A stiff neck develops.  You or your child are drooling or unable to swallow liquids.  You or your child are vomiting, unable to keep medications or liquids down.  You or your child has severe pain, unrelieved with recommended medications.  You or your child are having difficulty breathing (not due to stuffy nose).  You or your child are unable to fully open your mouth.  You or your child develop redness, swelling, or severe pain anywhere on the neck.  You have a fever.  Your baby is older than 3 months with a rectal temperature of 102 F (38.9 C) or higher.  Your baby is 59 months old or younger with a rectal temperature of 100.4 F (38 C) or higher.  MAKE SURE YOU:  Understand these instructions.  Will watch your condition.  Will get help right away if you are not doing well or get worse.  Document Released: 05/02/2005 Document Revised: 04/21/2011 Document Reviewed: 07/30/2007  [facility] Patient Information 2012 [place]  Colored, pus-like (purulent) drainage from the nose.   Swelling, warmth, and tenderness over the sinus areas may be signs of infection.  TREATMENT  Sinusitis is most often determined by an exam.X-rays may be taken. If x-rays have been taken, make sure you obtain your results or find out how you are to obtain them. Your caregiver may give you medications (antibiotics). These are medications that will help kill the bacteria causing the infection. You may also be given a  medication (decongestant) that helps to reduce sinus swelling.  Try Lloyd Huger Med Sinus rinse  HOME CARE INSTRUCTIONS   Only take over-the-counter or prescription medicines for pain, discomfort, or fever as directed by your caregiver.   Drink extra fluids. Fluids help thin the mucus so your sinuses can drain more easily.   Applying either moist heat or ice packs to the sinus areas may help relieve discomfort.   Use saline nasal sprays to help moisten your sinuses. The sprays can be found at your local drugstore.  SEEK IMMEDIATE MEDICAL CARE IF:  You have a fever.   You have increasing pain, severe headaches, or toothache.   You have nausea, vomiting, or drowsiness.   You develop unusual swelling around the face or trouble seeing.  MAKE SURE YOU:   Understand these instructions.   Will watch your condition.   Will get help right away if you are not doing well or get worse.  Document Released: 05/02/2005 Document Revised: 04/21/2011 Document Reviewed: 11/29/2006 [facility] Patient Information 2012 [place], [place].

## 2011-10-29 NOTE — ED Notes (Signed)
Pt has sore throat and cough for 4 days.

## 2011-10-29 NOTE — ED Provider Notes (Addendum)
History     CSN: [id number]  Arrival date & time 10/29/11  1825   First MD Initiated Contact with Patient 10/29/11 1852      Chief Complaint  Patient presents with  . URI    26y/o presents with sore throat, cough, greening nasal discharge and sputum which he has coughed up. He has a headache and had a temp of either 100.3 or 101.3 this AM.    The history is provided by the patient.    Past Medical History  Diagnosis Date  . Attention deficit hyperactivity disorder   . ADHD (attention deficit hyperactivity disorder)   . Asthma   . Tachycardia     Past Surgical History  Procedure Date  . Tonsillectomy     Family History  Problem Relation Age of Onset  . Heart failure Other   . Diabetes Other   . Hypertension Other     History  Substance Use Topics  . Smoking status: Current Everyday Smoker -- 0.5 packs/day  . Smokeless tobacco: Not on file  . Alcohol Use: Yes     rare      Review of Systems  Constitutional: Positive for fever, diaphoresis and fatigue.  HENT: Positive for congestion, sore throat, sneezing and postnasal drip.        Fullness in both ears but no pain or tinnitis Throat is very sore  Eyes: Negative.   Respiratory: Positive for cough and shortness of breath. Negative for wheezing and stridor.   Cardiovascular: Negative.   Gastrointestinal: Negative for nausea, abdominal pain, diarrhea and constipation.  Genitourinary: Negative.   Musculoskeletal: Negative.   Skin: Negative.   Neurological: Negative.   Hematological: Negative.   Psychiatric/Behavioral: Negative.     Allergies  Codeine; Homatropine; and Robaxin  Home Medications   Current Outpatient Rx  Name Route Sig Dispense Refill  . FLUTICASONE-SALMETEROL 100-50 MCG/DOSE IN AEPB Inhalation Inhale 1 puff into the lungs every 12 (twelve) hours.    . ACETAMINOPHEN 500 MG PO TABS Oral Take 1,000 mg by mouth every 6 (six) hours as needed. pain    . ALBUTEROL SULFATE HFA 108 (90 BASE)  MCG/ACT IN AERS Inhalation Inhale 2 puffs into the lungs every 6 (six) hours as needed.    . AMOXICILLIN-POT CLAVULANATE 875-125 MG PO TABS Oral Take 1 tablet by mouth 2 (two) times daily. 28 tablet 0  . FLUTICASONE PROPIONATE 50 MCG/ACT NA SUSP Nasal Place 2 sprays into the nose daily. 16 g 0  . IBUPROFEN 200 MG PO TABS Oral Take 400 mg by mouth every 6 (six) hours as needed. pain    . LISDEXAMFETAMINE DIMESYLATE 40 MG PO CAPS Oral Take 40 mg by mouth 2 (two) times daily.      BP 135/93  Pulse 107  Temp 98.7 F (37.1 C) (Oral)  Resp 20  SpO2 100%  Physical Exam  Constitutional: He is oriented to person, place, and time. He appears well-developed and well-nourished. No distress.       Mild distress  HENT:  Head: Normocephalic and atraumatic.       Oropharynx is erythematous. No exudate.   Eyes: EOM are normal. Pupils are equal, round, and reactive to light. Right eye exhibits no discharge. Left eye exhibits no discharge.  Cardiovascular: Normal rate, regular rhythm, normal heart sounds and intact distal pulses.   Pulmonary/Chest: Effort normal and breath sounds normal. No respiratory distress. He has no wheezes. He has no rales. He exhibits no tenderness.  Abdominal: Soft. Bowel  sounds are normal. He exhibits no distension. There is no tenderness.  Neurological: He is alert and oriented to person, place, and time. He has normal reflexes.  Skin: Skin is warm and dry.  Psychiatric: He has a normal mood and affect. His behavior is normal.    ED Course  Procedures (including critical care time)   Labs Reviewed  POCT RAPID STREP A ([facility] URG CARE ONLY)   No results found.       MDM  Acute Sinusitis Augmentin, Flonase, Neil Med sinus rinse, hydration. Cont Ibuprofen, Tessalon Perles, Nyquil, Mucinex and inhalers.   Pharyngitis Lozenges, choreseptic spray  NOTE: I have searched the patient in the [place] controlled substances reporting system and note that he has received multiple  prescriptions for narcotics and phenergan with codeine. I have also noted recent visits to the ER for pain.        Calvert Cantor, MD 10/29/11 1610  Calvert Cantor, MD 10/29/11 2153

## 2012-09-20 IMAGING — CR DG CHEST 2V
2 series · 2 of 2 positions shown · non-contrast
Comparison: None.

CLINICAL DATA: Cough for 1 month.

CHEST - 2 VIEW

[view not recorded (1 of 2)]
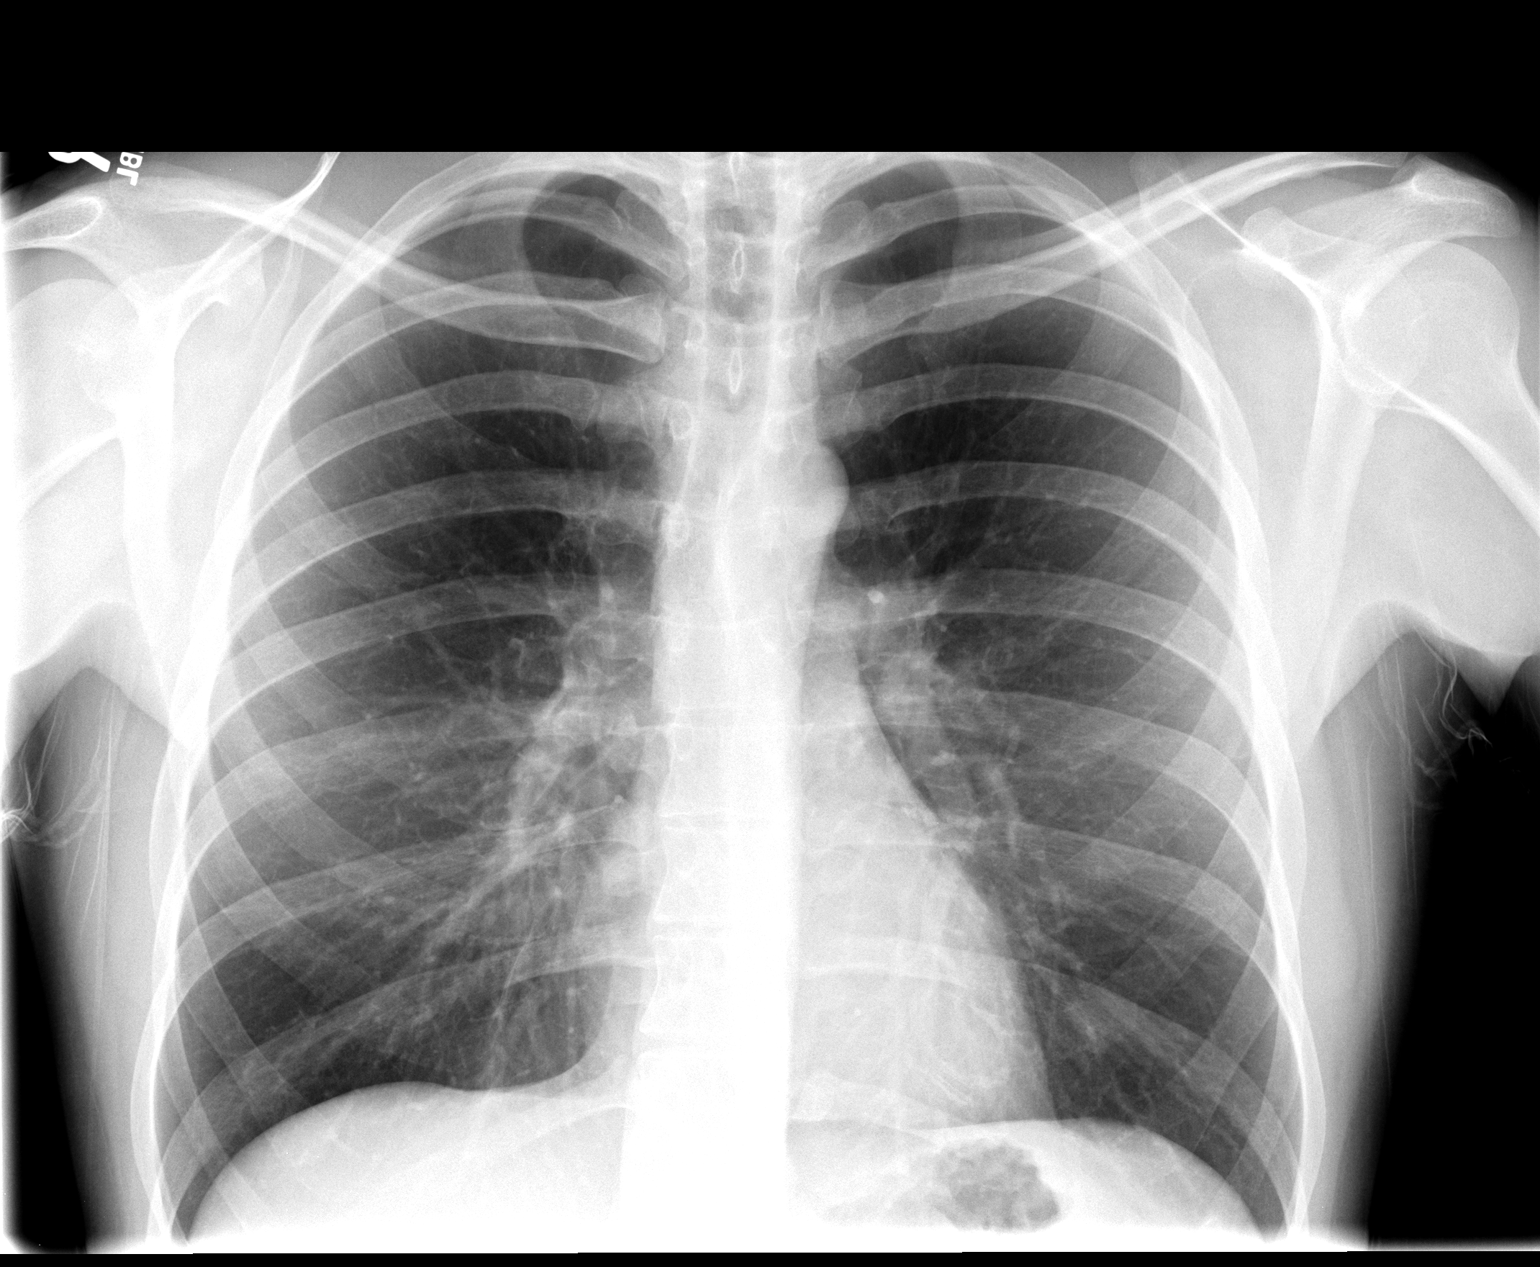

[view not recorded (2 of 2)]
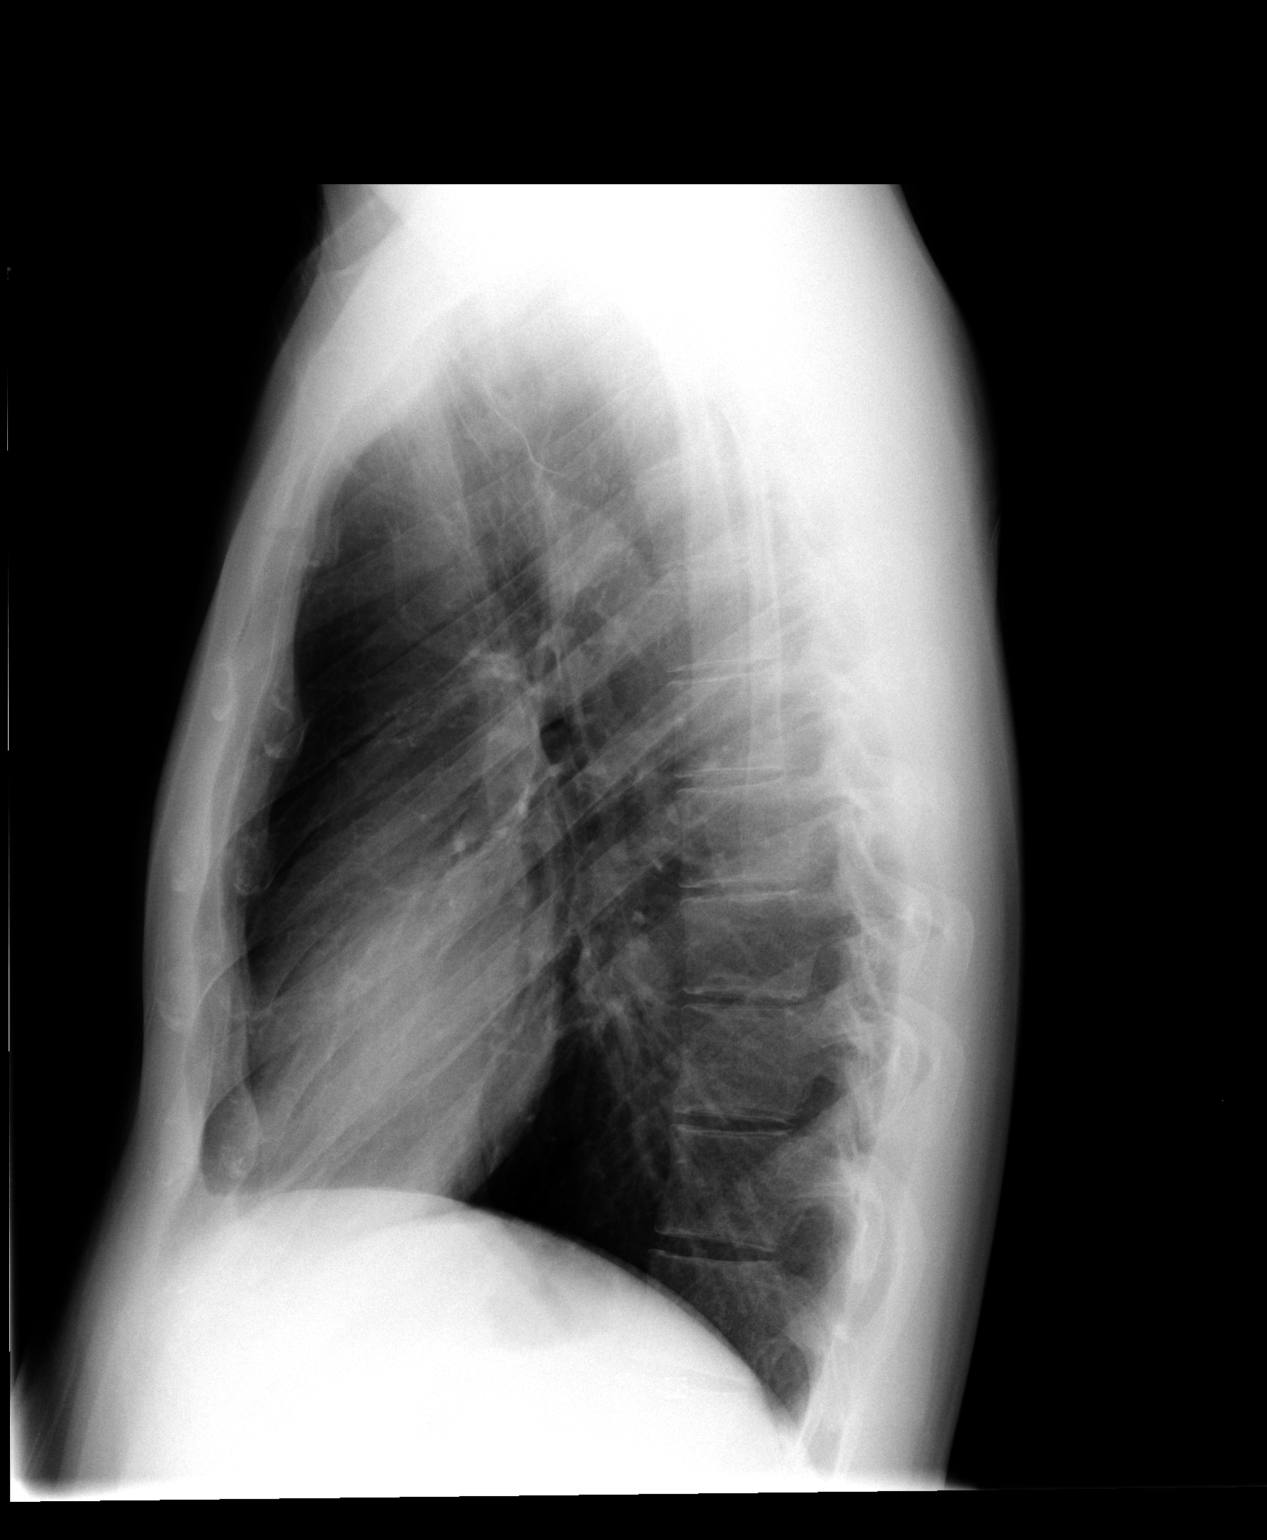

[2 of 2 positions shown; findings below may reference images not displayed]

FINDINGS: Lungs are clear.  No pneumothorax or pleural fluid.
Heart size is normal.
IMPRESSION: Negative chest.

## 2013-01-23 ENCOUNTER — Emergency Department (HOSPITAL_COMMUNITY)
Admission: EM | Admit: 2013-01-23 | Discharge: 2013-01-23 | Disposition: A | Discharge status: Home / Self Care | Payer: 59 | Attending: Emergency Medicine | Admitting: Emergency Medicine

## 2013-01-23 ENCOUNTER — Encounter (HOSPITAL_COMMUNITY): Payer: Self-pay | Admitting: *Deleted

## 2013-01-23 ENCOUNTER — Encounter: Payer: Self-pay | Admitting: Internal Medicine

## 2013-01-23 ENCOUNTER — Ambulatory Visit (INDEPENDENT_AMBULATORY_CARE_PROVIDER_SITE_OTHER): Payer: 59 | Admitting: Internal Medicine

## 2013-01-23 ENCOUNTER — Encounter: Payer: Self-pay | Admitting: *Deleted

## 2013-01-23 VITALS — BP 120/82 | HR 106 | Temp 98.6°F | Ht 72.0 in | Wt 126.4 lb

## 2013-01-23 DIAGNOSIS — R630 Anorexia: Secondary | ICD-10-CM | POA: Insufficient documentation

## 2013-01-23 DIAGNOSIS — R634 Abnormal weight loss: Secondary | ICD-10-CM

## 2013-01-23 DIAGNOSIS — F988 Other specified behavioral and emotional disorders with onset usually occurring in childhood and adolescence: Secondary | ICD-10-CM

## 2013-01-23 DIAGNOSIS — J45909 Unspecified asthma, uncomplicated: Secondary | ICD-10-CM | POA: Insufficient documentation

## 2013-01-23 DIAGNOSIS — F909 Attention-deficit hyperactivity disorder, unspecified type: Secondary | ICD-10-CM | POA: Insufficient documentation

## 2013-01-23 DIAGNOSIS — I10 Essential (primary) hypertension: Secondary | ICD-10-CM | POA: Insufficient documentation

## 2013-01-23 DIAGNOSIS — IMO0002 Reserved for concepts with insufficient information to code with codable children: Secondary | ICD-10-CM | POA: Insufficient documentation

## 2013-01-23 DIAGNOSIS — F410 Panic disorder [episodic paroxysmal anxiety] without agoraphobia: Secondary | ICD-10-CM

## 2013-01-23 DIAGNOSIS — R002 Palpitations: Secondary | ICD-10-CM | POA: Insufficient documentation

## 2013-01-23 DIAGNOSIS — Z79899 Other long term (current) drug therapy: Secondary | ICD-10-CM | POA: Insufficient documentation

## 2013-01-23 DIAGNOSIS — F17200 Nicotine dependence, unspecified, uncomplicated: Secondary | ICD-10-CM | POA: Insufficient documentation

## 2013-01-23 DIAGNOSIS — F411 Generalized anxiety disorder: Secondary | ICD-10-CM

## 2013-01-23 MED ORDER — MIRTAZAPINE 30 MG PO TABS: 30.0000 mg | ORAL_TABLET | Freq: Every day | ORAL | Status: DC

## 2013-01-23 MED ORDER — ALPRAZOLAM 0.25 MG PO TABS
0.2500 mg | ORAL_TABLET | Freq: Once | ORAL | Status: AC
  Administered 2013-01-23: 0.25 mg via ORAL
  Filled 2013-01-23: qty 1

## 2013-01-23 MED ORDER — ALPRAZOLAM 0.25 MG PO TABS: 0.2500 mg | ORAL_TABLET | Freq: Three times a day (TID) | ORAL | Status: DC | PRN

## 2013-01-23 MED ORDER — ALPRAZOLAM 1 MG PO TABS: 1.0000 mg | ORAL_TABLET | Freq: Three times a day (TID) | ORAL | Status: DC | PRN

## 2013-01-23 MED ORDER — SERTRALINE HCL 100 MG PO TABS: 100.0000 mg | ORAL_TABLET | Freq: Every day | ORAL | Status: DC

## 2013-01-23 MED ORDER — BUSPIRONE HCL 10 MG PO TABS: 10.0000 mg | ORAL_TABLET | Freq: Three times a day (TID) | ORAL | Status: DC

## 2013-01-23 MED ORDER — AMPHETAMINE-DEXTROAMPHETAMINE 10 MG PO TABS: 10.0000 mg | ORAL_TABLET | Freq: Two times a day (BID) | ORAL | Status: DC

## 2013-01-23 NOTE — ED Notes (Signed)
Previous history of panic attacks four years ago; started again about two wks ago; denies si/hi; feels like going to die sometimes when having panic attack; has trigger of seeing ex girlfriend car; insomnia; requesting something for anxiety until he can get established with physician; has used xanax in the past with success

## 2013-01-23 NOTE — Progress Notes (Signed)
Subjective:    Patient ID: Ethan Forbes, male    DOB: 1985-12-24, 27 y.o.   MRN: [id number]  New patient to me, here to establish with primary care provider Emergency room visit this morning reviewed  Anxiety Presents for initial visit. Onset was 1 to 6 months ago. The problem has been rapidly worsening. Symptoms include chest pain, compulsions, decreased concentration, depressed mood, dizziness, dry mouth, excessive worry, feeling of choking, hyperventilation, insomnia, irritability, malaise, muscle tension, nausea, nervous/anxious behavior, obsessions, palpitations, panic, restlessness and shortness of breath. Patient reports no confusion or suicidal ideas. Symptoms occur constantly. The severity of symptoms is interfering with daily activities and causing significant distress. The symptoms are aggravated by family issues, social activities and medication withdrawal. The quality of sleep is non-restorative. Nighttime awakenings: several.   Risk factors include family history and a major life event. His past medical history is significant for anxiety/panic attacks. There is no history of anemia, arrhythmia, asthma, bipolar disorder, CAD, CHF, chronic lung disease, depression, fibromyalgia, hyperthyroidism or suicide attempts. Compliance with medications is 76-100%.    Past Medical History  Diagnosis Date  . Attention deficit hyperactivity disorder   . ADHD (attention deficit hyperactivity disorder)   . Asthma   . Tachycardia   . Depression   . Hypertension    Family History  Problem Relation Age of Onset  . Heart failure Other   . Diabetes Other   . Hypertension Other    History  Substance Use Topics  . Smoking status: Current Every Day Smoker -- 0.50 packs/day  . Smokeless tobacco: Not on file  . Alcohol Use: Yes     Comment: rare    Review of Systems  Constitutional: Positive for appetite change, irritability, fatigue and unexpected weight change (loss: 20# in past 3 mo).  Negative for fever and diaphoresis.  Respiratory: Positive for choking, chest tightness and shortness of breath.   Cardiovascular: Positive for chest pain and palpitations. Negative for leg swelling.  Gastrointestinal: Positive for nausea.  Neurological: Positive for dizziness, tremors and light-headedness. Negative for weakness and headaches.  Psychiatric/Behavioral: Positive for decreased concentration. Negative for suicidal ideas and confusion. The patient is nervous/anxious and has insomnia.   No other specific complaints in a complete review of systems (except as listed in HPI above).      Objective:   Physical Exam BP 120/82  Pulse 106  Temp(Src) 98.6 F (37 C) (Oral)  Ht 6' (1.829 m)  Wt 126 lb 6.4 oz (57.335 kg)  BMI 17.14 kg/m2  SpO2 97% Wt Readings from Last 3 Encounters:  01/23/13 126 lb 6.4 oz (57.335 kg)   Constitutional: he is emotionally distressed, occasionally tearful - very thin but appears well-developed and well-nourished.  HENT: Head: Normocephalic and atraumatic. Ears: B TMs ok, no erythema or effusion; Nose: Nose normal. Mouth/Throat: Oropharynx is clear and moist. No oropharyngeal exudate.  Eyes: Conjunctivae and EOM are normal. Pupils are equal, round, and reactive to light. No scleral icterus.  Neck: Normal range of motion. Neck supple. No JVD present. No thyromegaly present.  Cardiovascular: Normal rate, regular rhythm and normal heart sounds.  No murmur heard. No BLE edema. Pulmonary/Chest: Effort normal and breath sounds normal. No respiratory distress. he has no wheezes.  Abdominal: Soft. Bowel sounds are normal. he exhibits no distension. There is no tenderness. no masses Musculoskeletal: Normal range of motion, no joint effusions. No gross deformities Neurological: he is alert and oriented to person, place, and time. No cranial nerve deficit. Coordination,  balance, strength, speech and gait are normal.  Skin: Skin is warm and dry. No rash noted. No  erythema.  Psychiatric: he has a very anxious and dysmorphic mood and affect. behavior is distractible. Judgment and thought content fair.  No results found for this basename: WBC, HGB, HCT, PLT, GLUCOSE, CHOL, TRIG, HDL, LDLDIRECT, LDLCALC, ALT, AST, NA, K, CL, CREATININE, BUN, CO2, TSH, PSA, INR, GLUF, HGBA1C, MICROALBUR       Assessment & Plan:   See problem list. Medications and labs reviewed today.  Time spent with pt today 45 minutes, greater than 50% time spent counseling patient on severe anxiety with increasing panic symptoms and medication review. Also review of available prior records

## 2013-01-23 NOTE — Patient Instructions (Signed)
It was good to see you today. We have reviewed your prior records including labs and tests today we will send to your prior provider(s) for "release of records" as discussed today. Or fax labs here Medications reviewed and updated start Remeron 30 mg at bedtime to help with sleep and appetite stimulation Resumed low-dose Adderall twice daily as discussed Continue Zoloft, BuSpar and alprazolam as ongoing Call with name for referral to psychiatry as discussed Continue working with your counselor through your employment program Followup in 2 weeks 4 continue review, call sooner if problems

## 2013-01-23 NOTE — Assessment & Plan Note (Signed)
?  Stress reaction to above Patient reports 20 pound loss in past 3 months Patient reports labs done in past 2 weeks by urgent care in [place], he will fax a copy of these labs which are normal by his report No other clear GI symptoms such as diarrhea, malabsorption, fever or pulmonary symptom We'll continue to follow and review labs as available

## 2013-01-23 NOTE — Assessment & Plan Note (Signed)
Severe symptoms, history of same, currently exacerbated by family stressors including breakup with fianc and father with metastatic melanoma. Other complications include recent employment change and moved from [place] to [place], [place] Patient has been started on max dose sertraline, BuSpar and when necessary alprazolam by urgent care provider in [place] -encouraged to continue same Will also add Remeron for appetite stimulation and sleep aid Ongoing counseling support 3-5 days per week through employer [employer] program, so denies need for other counseling referral Agrees to psychiatric consultation for medication assistance, patient will review options covered by his insurance and call us with name if referral needed Verified no SI/HI Also discussed and recognized complication of withdrawal from Adderall -see next Patient agrees to followup in 2 weeks for medication symptom review, he will call sooner here or 911/help line if worse prior to that time

## 2013-01-23 NOTE — Assessment & Plan Note (Signed)
History of same has been on Adderall therapy since college, recent self discontinuation in past 8 weeks  Patient reluctant to resume same, but agrees to reduced dose of prior maintenance therapy Patient will take 10 mg twice a day during work days, and take this in addition to his other meds for anxiety as reviewed Support offered, patient agrees to psychiatric followup and close care here

## 2013-01-23 NOTE — ED Provider Notes (Addendum)
CSN: [id number]     Arrival date & time 01/23/13  4098 History   First MD Initiated Contact with Patient 01/23/13 0701     Chief Complaint  Patient presents with  . Panic Attack   (Consider location/radiation/quality/duration/timing/severity/associated sxs/prior Treatment) HPI Comments: Attack past 2 weeks the patient has discovered his dad has end-stage cancer having long-term girlfriend broke up with him and he has had called the police on her for destroying property. He states in the last 4 months he's lost 20 pounds because of starting a new job that is very stressful and when he is stressed he cannot eat. He denies any suicidal or homicidal ideation. No hallucinations or paranoid behavior. He has tried exercise and relaxation without improvement in his panic symptoms that are now almost occurring daily. He states that many years ago he had something similar which he states he eventually grew out and did not take medication for. He denies any drug or alcohol use.  Patient is a 27 y.o. male presenting with anxiety. The history is provided by the patient.  Anxiety This is a recurrent problem. Episode onset: 2 weeks ago. The problem occurs daily. The problem has been gradually worsening. Pertinent negatives include no chest pain, no abdominal pain and no shortness of breath. Associated symptoms comments: Heart racing, sweating, sensation that his throat is closing up and general panic. The symptoms are aggravated by stress. Nothing relieves the symptoms. He has tried rest (Relaxation) for the symptoms. The treatment provided no relief.    Past Medical History  Diagnosis Date  . Attention deficit hyperactivity disorder   . ADHD (attention deficit hyperactivity disorder)   . Asthma   . Tachycardia    Past Surgical History  Procedure Laterality Date  . Tonsillectomy     Family History  Problem Relation Age of Onset  . Heart failure Other   . Diabetes Other   . Hypertension Other     History  Substance Use Topics  . Smoking status: Current Every Day Smoker -- 0.50 packs/day  . Smokeless tobacco: Not on file  . Alcohol Use: Yes     Comment: rare    Review of Systems  Constitutional: Positive for appetite change and unexpected weight change. Negative for fever and chills.  Respiratory: Negative for cough and shortness of breath.   Cardiovascular: Negative for chest pain.  Gastrointestinal: Negative for nausea, vomiting and abdominal pain.  All other systems reviewed and are negative.    Allergies  Codeine; Ativan; Homatropine; and Robaxin  Home Medications   Current Outpatient Rx  Name  Route  Sig  Dispense  Refill  . acetaminophen (TYLENOL) 500 MG tablet   Oral   Take 1,000 mg by mouth every 6 (six) hours as needed. pain         . albuterol (PROVENTIL HFA;VENTOLIN HFA) 108 (90 BASE) MCG/ACT inhaler   Inhalation   Inhale 2 puffs into the lungs every 6 (six) hours as needed.         Marland Kitchen EXPIRED: fluticasone (FLONASE) 50 MCG/ACT nasal spray   Nasal   Place 2 sprays into the nose daily.   16 g   0   . Fluticasone-Salmeterol (ADVAIR) 100-50 MCG/DOSE AEPB   Inhalation   Inhale 1 puff into the lungs every 12 (twelve) hours.         Marland Kitchen ibuprofen (ADVIL,MOTRIN) 200 MG tablet   Oral   Take 400 mg by mouth every 6 (six) hours as needed. pain         .  lisdexamfetamine (VYVANSE) 40 MG capsule   Oral   Take 40 mg by mouth 2 (two) times daily.          BP 144/99  Pulse 98  Temp(Src) 98.9 F (37.2 C) (Oral)  Resp 20  SpO2 100% Physical Exam  Nursing note and vitals reviewed. Constitutional: He is oriented to person, place, and time. He appears well-developed and well-nourished.  HENT:  Head: Normocephalic and atraumatic.  Eyes: Conjunctivae and EOM are normal. Pupils are equal, round, and reactive to light.  Neck: Normal range of motion. Neck supple.  Cardiovascular: Normal rate, regular rhythm and intact distal pulses.   No murmur  heard. Pulmonary/Chest: Effort normal and breath sounds normal. No respiratory distress. He has no wheezes. He has no rales.  Abdominal: Soft.  Neurological: He is alert and oriented to person, place, and time.  Skin: Skin is warm and dry. No rash noted. No erythema.  Psychiatric: His behavior is normal. Thought content normal. His mood appears anxious. He is not aggressive, not withdrawn and not actively hallucinating. Thought content is not paranoid. Cognition and memory are not impaired. He expresses no homicidal and no suicidal ideation. He expresses no suicidal plans and no homicidal plans.  Tearful on exam He is attentive.    ED Course  Procedures (including critical care time) Labs Review Labs Reviewed - No data to display Imaging Review No results found.  MDM   1. Panic attack as reaction to stress     Patient is here complaining of anxiety and panic attacks. Recently he has been about his tach has end-stage cancer and a girlfriend of 4 years broke up with him and has been. Since that time over the last 2 weeks he's had frequent episodes of heart rates pain, sweating and sensation he's going to die. 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 He denies any suicidal or homicidal ideation at this time. He also states that he takes out her old but has not been taking it because he was afraid it was worsening the anxiety. He denies any drug or alcohol use. He is slightly tachycardic here however he has a noted history of this and states that he had his thyroid checked 2 months ago and was normal.  Because of his allergy to Ativan will give him a short course of Xanax to use when necessary for panic attacks.    Gwyneth Sprout, MD 01/23/13 1610  Gwyneth Sprout, MD 01/23/13 [phone]

## 2013-01-31 ENCOUNTER — Telehealth: Payer: Self-pay | Admitting: *Deleted

## 2013-01-31 NOTE — Telephone Encounter (Signed)
Pt called states while moving, he packed all bathroom items together.  Further states cleaning solution poured onto medications.  Pt is requesting refill on Xanax.  Please advise in Dr Diamantina Monks absence

## 2013-01-31 NOTE — Telephone Encounter (Signed)
No early refills. He needs to be more responsible with his controlled substances. He can have it refilled on the 10th of October.

## 2013-01-31 NOTE — Telephone Encounter (Signed)
Left message for pt to return call to [facility] 

## 2013-02-08 ENCOUNTER — Other Ambulatory Visit: Payer: Self-pay | Admitting: *Deleted

## 2013-02-08 ENCOUNTER — Ambulatory Visit: Payer: 59 | Admitting: Internal Medicine

## 2013-02-08 MED ORDER — SERTRALINE HCL 100 MG PO TABS: 100.0000 mg | ORAL_TABLET | Freq: Every day | ORAL | Status: DC

## 2013-02-08 MED ORDER — ALPRAZOLAM 1 MG PO TABS: 1.0000 mg | ORAL_TABLET | Freq: Three times a day (TID) | ORAL | Status: DC | PRN

## 2013-02-08 NOTE — Telephone Encounter (Signed)
Notified pt with md response. Pt want rx sen to [facility].inform him will send,,,lmb

## 2013-02-08 NOTE — Telephone Encounter (Signed)
Ok with me - where should we send?

## 2013-02-08 NOTE — Telephone Encounter (Signed)
Pt called requesting early refill on Xanax and Zoloft due to insurance ending on 9.30.2014.  Please advise

## 2013-02-11 ENCOUNTER — Telehealth: Payer: Self-pay | Admitting: Internal Medicine

## 2013-02-11 ENCOUNTER — Ambulatory Visit: Payer: 59 | Admitting: Internal Medicine

## 2013-02-11 NOTE — Telephone Encounter (Signed)
noted 

## 2013-02-11 NOTE — Telephone Encounter (Signed)
Pt wants to let Dr. Felicity Coyer know that he is sorry that he keep on rescheduling an appt. Pt stated that he is so thankful for everything Dr. Felicity Coyer did for him. He is sorry that he had to reschedule an appt that he had today due to car trouble.

## 2013-02-18 ENCOUNTER — Ambulatory Visit (INDEPENDENT_AMBULATORY_CARE_PROVIDER_SITE_OTHER): Payer: 59 | Admitting: Internal Medicine

## 2013-02-18 ENCOUNTER — Encounter: Payer: Self-pay | Admitting: Internal Medicine

## 2013-02-18 VITALS — BP 120/80 | HR 115 | Temp 98.6°F | Wt 125.8 lb

## 2013-02-18 DIAGNOSIS — R634 Abnormal weight loss: Secondary | ICD-10-CM

## 2013-02-18 DIAGNOSIS — F411 Generalized anxiety disorder: Secondary | ICD-10-CM

## 2013-02-18 MED ORDER — ALPRAZOLAM 0.5 MG PO TABS: 0.5000 mg | ORAL_TABLET | Freq: Three times a day (TID) | ORAL | Status: DC | PRN

## 2013-02-18 MED ORDER — MIRTAZAPINE 45 MG PO TABS: 45.0000 mg | ORAL_TABLET | Freq: Every day | ORAL | Status: DC

## 2013-02-18 NOTE — Assessment & Plan Note (Addendum)
?  Stress reaction to above Patient reports 20 pound loss in past 3 months - weight stable since September 10,2014 No other clear GI symptoms such as diarrhea, malabsorption, fever or pulmonary symptom Increase Remeron to 45mg  daily today to help with appetite.

## 2013-02-18 NOTE — Assessment & Plan Note (Addendum)
Severe symptoms, history of same, currently exacerbated by family stressors including breakup with fianc and father with metastatic melanoma.  Other complications include recent employment change and moved from [place] to [place], [place] Patient has been started on max dose sertraline, BuSpar and prn alprazolam by urgent care provider in [place]. Added Remeron and Adderall last OV   Pt feels he is improving on sertraline and Remeron;  Requesting lower dose of Xanax today.   Will decrease Xanax to 0.5mg  tid prn and increase Remeron to 45mg  daily for sleep aid.  Ongoing counseling support 3-5 days per week through [employer] program, so denies need for other counseling referral Verified no SI/HI Patient agrees to followup in 4-6 weeks for medication symptom review, he will call sooner here or 911/help line if worse prior to that time

## 2013-02-18 NOTE — Telephone Encounter (Signed)
Pt came by office gave him the script for alprazolam...lmb

## 2013-02-18 NOTE — Progress Notes (Signed)
  Subjective:    Patient ID: Ethan Forbes, male    DOB: Oct 19, 1985, 27 y.o.   MRN: [id number]  HPI  Patient here today for follow up.  Chronic medical issues reviewed -  Anxiety - symptoms worsened since March 2014.  Initially evaluated in this office September of 2014.  At that time, Zoloft was added as well as Xanax.  Pt feels that Zoloft is helping.  He thinks a lower dose of Xanax would be better.  Recently lost his job and is worried about being able to communicate that with his parents.   Weight loss - weight stable since visit 01-23-2013.  Remeron added at visit 01-23-13. Pt still reports decreased appetite, but taking Boost daily.  ADD - pt had been on Adderall therapy since college, discontinued on his own summer 2014.  Low dose added back September 10,2014.   Past Medical History  Diagnosis Date  . Attention deficit hyperactivity disorder   . ADHD (attention deficit hyperactivity disorder)   . Asthma   . Tachycardia   . Depression   . Hypertension   . ADD (attention deficit disorder)     Review of Systems  Constitutional: Positive for appetite change (with persistent decreased appetite). Negative for fever and chills.  Respiratory: Negative for shortness of breath.   Cardiovascular: Negative for chest pain and palpitations.  Neurological: Negative for syncope and headaches.  Psychiatric/Behavioral: Positive for sleep disturbance and agitation. The patient is nervous/anxious.        Objective:   Physical Exam  Vitals reviewed. Constitutional: He is oriented to person, place, and time. He appears well-developed and well-nourished. No distress.  Cardiovascular: Normal heart sounds.   No extrasystoles are present. Tachycardia present.   Pulmonary/Chest: Effort normal and breath sounds normal. No respiratory distress. He has no wheezes.  Neurological: He is alert and oriented to person, place, and time.  Skin: Skin is warm and dry. No rash noted. He is not diaphoretic.   Psychiatric:  Anxious appearing     BP Readings from Last 3 Encounters:  02/18/13 120/80  01/23/13 120/82  01/23/13 132/79   Wt Readings from Last 3 Encounters:  02/18/13 125 lb 12.8 oz (57.063 kg)  01/23/13 126 lb 6.4 oz (57.335 kg)       Assessment & Plan:  See problem list -

## 2013-02-18 NOTE — Patient Instructions (Signed)
It was good to see you today. We have reviewed your prior records including labs and tests today Medications reviewed and updated increase Remeron to 45 mg at bedtime to help with sleep and appetite stimulation Reduce Xanax to 0.5 mg 3 times daily as needed Continue Zoloft, BuSpar and Adderall as ongoing Continue working with your counselor and call if referral to new therapist is needed Followup in 4-6 weeks for continued review, call sooner if problems

## 2013-02-21 ENCOUNTER — Encounter: Payer: Self-pay | Admitting: Internal Medicine

## 2013-02-22 ENCOUNTER — Telehealth: Payer: Self-pay | Admitting: *Deleted

## 2013-02-22 MED ORDER — ALPRAZOLAM 1 MG PO TABS: 1.0000 mg | ORAL_TABLET | Freq: Three times a day (TID) | ORAL | Status: DC | PRN

## 2013-02-22 MED ORDER — SERTRALINE HCL 100 MG PO TABS: 200.0000 mg | ORAL_TABLET | Freq: Every day | ORAL | Status: DC

## 2013-02-22 NOTE — Telephone Encounter (Signed)
Left msg on vm stating wanting to let md know that he talk with his parents about situation. Told them everything. Did no go well either. Wanting to see can he bring his parents to discuss issues. Also is it ok to increase zoloft & xanax...lmb

## 2013-02-22 NOTE — Telephone Encounter (Signed)
I will respond to patient via MyChart to this concern - thanks

## 2013-02-22 NOTE — Addendum Note (Signed)
Addended by: Rene Paci A on: 02/22/2013 05:09 PM   Modules accepted: Orders

## 2013-02-25 ENCOUNTER — Telehealth: Payer: Self-pay | Admitting: *Deleted

## 2013-02-25 NOTE — Telephone Encounter (Signed)
[facility] Triage Call Report Triage Record Num: [id number] Operator: Ether Griffins Patient Name: Ethan Forbes Call Date & Time: 02/22/2013 7:46:33PM Patient Phone: [phone] PCP: Rene Paci Patient Gender: Male PCP Fax : [phone] Patient DOB: [birth year] [facility] Name[facility] Reason for Call: Caller: [place]/Patient; PCP: Rene Paci (Adults only); CB#: [phone]; Calling with med issue: Alprazolam. Was supposed to be called to pharmacy([facility]) but they don't have it. [facility],[place]--[phone] and spoke to pharmacist Rosanna. Per EPIC chart ordered Alprazolam 1 mg, take 1 tab PO TID as needed for sleep or anxiety(disp #90) no refills. Pt aware that script has been called to pharmacy. Protocol(s) Used: Medication Questions - Adult Recommended Outcome per Protocol: Call Provider within 4 Hours Reason for Outcome: Prescription ordered today and not available at pharmacy putting patient at clinical risk Care Advice: ~ Have pharmacy phone number and prescription information available when you speak with provider. 02/22/2013 8:18:35PM Page 1 of 1 [email or web address]

## 2013-03-11 ENCOUNTER — Other Ambulatory Visit: Payer: Self-pay | Admitting: Internal Medicine

## 2013-03-11 MED ORDER — ALPRAZOLAM 1 MG PO TABS: 1.0000 mg | ORAL_TABLET | Freq: Three times a day (TID) | ORAL | Status: DC | PRN

## 2013-03-11 NOTE — Addendum Note (Signed)
Addended by: Rene Paci A on: 03/11/2013 05:42 PM   Modules accepted: Orders

## 2013-03-13 MED ORDER — ALPRAZOLAM 0.25 MG PO TABS: 0.2500 mg | ORAL_TABLET | Freq: Three times a day (TID) | ORAL | Status: DC | PRN

## 2013-03-13 NOTE — Addendum Note (Signed)
Addended by: Rene Paci A on: 03/13/2013 04:26 PM   Modules accepted: Orders

## 2013-03-15 ENCOUNTER — Ambulatory Visit: Payer: Self-pay | Admitting: Internal Medicine

## 2013-03-18 ENCOUNTER — Telehealth: Payer: Self-pay | Admitting: *Deleted

## 2013-03-18 NOTE — Telephone Encounter (Signed)
Pt called states [facility] filled Xanax 1mg  rx; pt is wanting 0.25mg  Xanax refill.  Further states [facility] gave him a photocopy of the 0.25mg  Rx.  He is unable to fill Rx with that Rx.  Pt is requesting another Rx written for 0.25mg  for him to pick up at [facility] to take to another pharmacy.  Pt does have 1mg  Xanax in his possession.  Please advise

## 2013-03-19 ENCOUNTER — Telehealth: Payer: Self-pay | Admitting: *Deleted

## 2013-03-19 ENCOUNTER — Telehealth: Payer: Self-pay

## 2013-03-19 NOTE — Telephone Encounter (Signed)
The patient called hoping to get a new rx for Adderall. Thanks!

## 2013-03-19 NOTE — Telephone Encounter (Signed)
Sorry, no. I have filled prescriptions for Xanax 0.25, 0.5 and 1 mg in the past 6 weeks - I will not write another prescription for Xanax and the next 30 days. Likewise, I will not refill Adderall as it appears patient has recently received this prescription from Dr. Dub Mikes  Please call pt and let him know same I have also responded to him via My Chart on these issues thanks

## 2013-03-19 NOTE — Telephone Encounter (Signed)
See prior phone message from today through triage.  No refill on Adderall. Thanks

## 2013-03-19 NOTE — Telephone Encounter (Signed)
[facility] Triage Call Report Triage Record Num: [id number] Operator: Judeen Hammans Patient Name: Jonmichael Beadnell Call Date & Time: 03/18/2013 3:13:36AM Patient Phone: [phone] PCP: Rene Paci Patient Gender: Male PCP Fax : [phone] Patient DOB: [birth year] [facility] Name[facility] Reason for Call: Caller: [place]/Patient; PCP: Rene Paci (Adults only); CB#: [phone]; Call regarding Panic Attack; Pt reports having just getting over a panic attack. Pt went to pick up Rx for Xanax at [facility] and was told that his medication was already filled (unbenounced to pt) - pharmacy would not give pt medication for that reason, rather, made a copy of the prescription for the fax and advised pt that he could have the Rx filled elsewhere. Pt took script to [facility] and they would not fill it because it was not an authentic script. Pt frustrated. Accessed EMR and verified that Dr. Felicity Coyer ordered Xanax 0.25mg  tabs x 60. Explained to pt that I could call in 1 tablet only and that he would have to call office when open to obtain full refill. Pt states he will just wait until office is open to discuss the situation and go from there. No symptoms at present to triage. Protocol(s) Used: Office Note Recommended Outcome per Protocol: Information Noted and Sent to Office Reason for Outcome: Caller information to office Care Advice: ~ 11/

## 2013-03-19 NOTE — Telephone Encounter (Signed)
Spoke with pt advised of MDs message 

## 2013-03-21 ENCOUNTER — Other Ambulatory Visit: Payer: Self-pay

## 2013-03-24 ENCOUNTER — Other Ambulatory Visit: Payer: Self-pay | Admitting: Internal Medicine

## 2013-03-25 NOTE — Telephone Encounter (Signed)
See prior phone note and MyChart exchange - no refill on controlled substances -  Case involves too much confusion re: xanax dose requested OV to review same and clarify if needed

## 2013-03-26 ENCOUNTER — Ambulatory Visit: Payer: 59 | Admitting: Internal Medicine

## 2013-03-26 DIAGNOSIS — Z0289 Encounter for other administrative examinations: Secondary | ICD-10-CM

## 2013-04-02 ENCOUNTER — Telehealth: Payer: Self-pay | Admitting: *Deleted

## 2013-04-02 NOTE — Telephone Encounter (Signed)
Will hold msg and forward to md on Thursday when she return. Along with the faxes from [facility] with info...lmb

## 2013-04-02 NOTE — Telephone Encounter (Signed)
Delice Bison from [facility] called states she believes pt is forging Rxs.  States pt has filled #270 1mg  Xanax and #90 0.5mg  Xanax in one month written by Dr Felicity Coyer.  States pt presented an Rx with [employer] [facility] on the left and the Rx with [facility] on the right on Saturday.  Delice Bison has faxed copy of Rx and printout from [facility] narcotic registry.

## 2013-04-04 NOTE — Telephone Encounter (Signed)
Spoke with Ethan Forbes, advised of MDs message.  She states she is going to contact the Narcotic Detective and he may be contacting Dr Felicity Coyer.

## 2013-04-04 NOTE — Telephone Encounter (Signed)
I have never rx'd #270 xanax 1mg  for this patient (EMR record reviewed) Please inform pharmacist of same -does the pharmacist have recommendations on our appropriate next step/action? I will NOT be prescribing ANY other controlled substance for this patient thanks

## 2013-07-15 ENCOUNTER — Encounter: Payer: Self-pay | Admitting: General Surgery

## 2013-07-15 DIAGNOSIS — R000 Tachycardia, unspecified: Secondary | ICD-10-CM

## 2016-04-30 ENCOUNTER — Encounter (HOSPITAL_COMMUNITY): Payer: Self-pay | Admitting: *Deleted

## 2016-04-30 ENCOUNTER — Emergency Department (HOSPITAL_COMMUNITY)
Admission: EM | Admit: 2016-04-30 | Discharge: 2016-04-30 | Disposition: A | Discharge status: Home / Self Care | Payer: Self-pay | Attending: Emergency Medicine | Admitting: Emergency Medicine

## 2016-04-30 ENCOUNTER — Encounter (HOSPITAL_COMMUNITY): Payer: Self-pay | Admitting: Emergency Medicine

## 2016-04-30 ENCOUNTER — Emergency Department (HOSPITAL_COMMUNITY): Payer: Self-pay

## 2016-04-30 DIAGNOSIS — J45909 Unspecified asthma, uncomplicated: Secondary | ICD-10-CM | POA: Insufficient documentation

## 2016-04-30 DIAGNOSIS — R319 Hematuria, unspecified: Secondary | ICD-10-CM | POA: Insufficient documentation

## 2016-04-30 DIAGNOSIS — F17200 Nicotine dependence, unspecified, uncomplicated: Secondary | ICD-10-CM | POA: Insufficient documentation

## 2016-04-30 DIAGNOSIS — R109 Unspecified abdominal pain: Secondary | ICD-10-CM | POA: Insufficient documentation

## 2016-04-30 DIAGNOSIS — Y929 Unspecified place or not applicable: Secondary | ICD-10-CM | POA: Insufficient documentation

## 2016-04-30 DIAGNOSIS — F909 Attention-deficit hyperactivity disorder, unspecified type: Secondary | ICD-10-CM | POA: Insufficient documentation

## 2016-04-30 DIAGNOSIS — I10 Essential (primary) hypertension: Secondary | ICD-10-CM | POA: Insufficient documentation

## 2016-04-30 DIAGNOSIS — Y939 Activity, unspecified: Secondary | ICD-10-CM | POA: Insufficient documentation

## 2016-04-30 DIAGNOSIS — N390 Urinary tract infection, site not specified: Secondary | ICD-10-CM | POA: Insufficient documentation

## 2016-04-30 DIAGNOSIS — S161XXA Strain of muscle, fascia and tendon at neck level, initial encounter: Secondary | ICD-10-CM | POA: Insufficient documentation

## 2016-04-30 DIAGNOSIS — Y999 Unspecified external cause status: Secondary | ICD-10-CM | POA: Insufficient documentation

## 2016-04-30 DIAGNOSIS — S39012A Strain of muscle, fascia and tendon of lower back, initial encounter: Secondary | ICD-10-CM | POA: Insufficient documentation

## 2016-04-30 LAB — URINALYSIS, ROUTINE W REFLEX MICROSCOPIC
BACTERIA UA: NONE SEEN
BILIRUBIN URINE: NEGATIVE
Glucose, UA: NEGATIVE mg/dL
Hgb urine dipstick: NEGATIVE
KETONES UR: 20 mg/dL — AB
Nitrite: NEGATIVE
Protein, ur: 30 mg/dL — AB
SPECIFIC GRAVITY, URINE: 1.031 — AB (ref 1.005–1.030)
pH: 5 (ref 5.0–8.0)

## 2016-04-30 LAB — BASIC METABOLIC PANEL
ANION GAP: 9 (ref 5–15)
BUN: 15 mg/dL (ref 6–20)
CALCIUM: 9.1 mg/dL (ref 8.9–10.3)
CHLORIDE: 104 mmol/L (ref 101–111)
CO2: 27 mmol/L (ref 22–32)
CREATININE: 1 mg/dL (ref 0.61–1.24)
GFR calc non Af Amer: >60 mL/min (ref >60)
Glucose, Bld: 90 mg/dL (ref 65–99)
Potassium: 3.2 mmol/L — ABNORMAL LOW (ref 3.5–5.1)
SODIUM: 140 mmol/L (ref 135–145)

## 2016-04-30 LAB — CBC
HCT: 40.9 % (ref 39.0–52.0)
Hemoglobin: 13.7 g/dL (ref 13.0–17.0)
MCH: 29.6 pg (ref 26.0–34.0)
MCHC: 33.5 g/dL (ref 30.0–36.0)
MCV: 88.3 fL (ref 78.0–100.0)
PLATELETS: 262 10*3/uL (ref 150–400)
RBC: 4.63 MIL/uL (ref 4.22–5.81)
RDW: 12.7 % (ref 11.5–15.5)
WBC: 7.8 10*3/uL (ref 4.0–10.5)

## 2016-04-30 MED ORDER — KETOROLAC TROMETHAMINE 30 MG/ML IJ SOLN
30.0000 mg | Freq: Once | INTRAMUSCULAR | Status: DC
  Filled 2016-04-30: qty 1

## 2016-04-30 MED ORDER — SODIUM CHLORIDE 0.9 % IV BOLUS (SEPSIS)
1000.0000 mL | Freq: Once | INTRAVENOUS | Status: AC
Start: 2016-04-30 — End: 2016-04-30
  Administered 2016-04-30: 1000 mL via INTRAVENOUS

## 2016-04-30 MED ORDER — NAPROXEN 500 MG PO TABS
500.0000 mg | ORAL_TABLET | Freq: Once | ORAL | Status: DC
  Filled 2016-04-30: qty 1

## 2016-04-30 MED ORDER — TRAMADOL HCL 50 MG PO TABS
50.0000 mg | ORAL_TABLET | Freq: Once | ORAL | Status: AC
Start: 2016-04-30 — End: 2016-04-30
  Administered 2016-04-30: 50 mg via ORAL
  Filled 2016-04-30: qty 1

## 2016-04-30 NOTE — ED Notes (Signed)
Pt still unable to provide urine sample 

## 2016-04-30 NOTE — ED Triage Notes (Signed)
Pt reports right flank pain, Hx kidney stones, hematuria, oliguria. Pt in severe distress. Unable to sit still due to pain.

## 2016-04-30 NOTE — Discharge Instructions (Signed)
Please read and follow all provided instructions.  Your diagnoses today include:  1. Right flank pain     Tests performed today include:  Blood test that showed normal kidney function  CT abd/pelvis - does not show kidney stone or other problem  Vital signs. See below for your results today.   Medications prescribed:   None  Take any prescribed medications only as directed.  Home care instructions:  Follow any educational materials contained in this packet.  Follow-up instructions: Follow-up with your physician for recheck.  Return instructions:   Please return to the Emergency Department if you experience worsening symptoms.  Please return if you develop fever or uncontrolled pain or vomiting.  Please return if you have any other emergent concerns.  Additional Information:  Your vital signs today were: BP 122/86    Pulse 88    Temp 97.7 F (36.5 C) (Oral)    Resp 18    Ht 6' (1.829 m)    Wt 65 kg    SpO2 100%    BMI 19.44 kg/m  If your blood pressure (BP) was elevated above 135/85 this visit, please have this repeated by your doctor within one month. --------------

## 2016-04-30 NOTE — ED Notes (Signed)
Pt stated unable to give urine sample at this time. Urinal and call light at bedside. 

## 2016-04-30 NOTE — ED Notes (Signed)
Patient transported to CT 

## 2016-04-30 NOTE — ED Notes (Signed)
Pt states he is allergic to toradol and can not have that medication.

## 2016-04-30 NOTE — ED Notes (Signed)
Patient states he is allergic to Toradol. Patient requesting pain medication prior to CT. PA made aware.

## 2016-04-30 NOTE — ED Notes (Signed)
EDP notified of pt's request for pain medication. Pt reminded of need for a urine sample

## 2016-04-30 NOTE — ED Provider Notes (Signed)
[facility]-EMERGENCY DEPT Provider Note   CSN: [id number] Arrival date & time: 04/30/16  1811     History   Chief Complaint Chief Complaint  Patient presents with  . Flank Pain    right    HPI Ethan Forbes is a 30 y.o. male.  Patient with reported history of kidney stones 3 presents with complaint of right-sided flank pain, acute onset, 2 hours ago. States that he was able to pass these before. Pain is in the right flank. Does not radiate. No vomiting. Treated before with Flomax and oxycodone. No fevers. He states that he has seen red coloration of his urine. The onset of this condition was acute. The course is constant. Aggravating factors: none. Alleviating factors: none. [place] Substance Database shows Suboxone use through October. Also on Dextroamphetamine, temazepam, and klonopin currently.       Past Medical History:  Diagnosis Date  . ADHD (attention deficit hyperactivity disorder)   . Allergic rhinitis   . Asthma   . Attention deficit hyperactivity disorder   . Depression   . Hypertension   . Tachycardia     Patient Active Problem List   Diagnosis Date Noted  . Tachycardia 07/15/2013  . Generalized anxiety disorder   . ADD (attention deficit disorder)   . Unintentional weight loss     Past Surgical History:  Procedure Laterality Date  . TONSILLECTOMY         Home Medications    Prior to Admission medications   Medication Sig Start Date End Date Taking? Authorizing Provider  acetaminophen (TYLENOL) 500 MG tablet Take 1,000 mg by mouth every 6 (six) hours as needed. pain    Historical Provider, MD  ALPRAZolam (XANAX) 0.25 MG tablet Take 1 tablet (0.25 mg total) by mouth 3 (three) times daily as needed for sleep or anxiety. 03/13/13   Newt Lukes, MD  amphetamine-dextroamphetamine (ADDERALL) 10 MG tablet Take 1 tablet (10 mg total) by mouth 2 (two) times daily. 01/23/13   Newt Lukes, MD  busPIRone (BUSPAR) 10 MG tablet Take 1 tablet (10 mg  total) by mouth 3 (three) times daily. 01/23/13   Newt Lukes, MD  ibuprofen (ADVIL,MOTRIN) 200 MG tablet Take 400 mg by mouth every 6 (six) hours as needed. pain    Historical Provider, MD  mirtazapine (REMERON) 45 MG tablet Take 1 tablet (45 mg total) by mouth at bedtime. 02/18/13   Newt Lukes, MD  sertraline (ZOLOFT) 100 MG tablet Take 2 tablets (200 mg total) by mouth daily. 02/22/13   Newt Lukes, MD    Family History Family History  Problem Relation Age of Onset  . Heart failure Other   . Diabetes Other   . Hypertension Other     Social History Social History  Substance Use Topics  . Smoking status: Current Every Day Smoker    Packs/day: 0.50  . Smokeless tobacco: Never Used  . Alcohol use Yes     Comment: rare     Allergies   Codeine; Hydromet [hydrocodone-homatropine]; Ativan [lorazepam]; and Robaxin [methocarbamol]   Review of Systems Review of Systems  Constitutional: Negative for fever.  HENT: Negative for rhinorrhea and sore throat.   Eyes: Negative for redness.  Respiratory: Negative for cough.   Cardiovascular: Negative for chest pain.  Gastrointestinal: Negative for abdominal pain, diarrhea, nausea and vomiting.  Genitourinary: Positive for decreased urine volume, flank pain and hematuria. Negative for dysuria.  Musculoskeletal: Negative for myalgias.  Skin: Negative for rash.  Neurological: Negative for headaches.     Physical Exam Updated Vital Signs BP 119/85 (BP Location: Left Arm)   Pulse 116   Temp 97.7 F (36.5 C) (Oral)   Resp 18   Ht 6' (1.829 m)   Wt 65 kg   SpO2 100%   BMI 19.44 kg/m   Physical Exam  Constitutional: He appears well-developed and well-nourished.  HENT:  Head: Normocephalic and atraumatic.  Eyes: Conjunctivae are normal. Right eye exhibits no discharge. Left eye exhibits no discharge.  Neck: Normal range of motion. Neck supple.  Cardiovascular: Normal rate, regular rhythm and normal heart  sounds.   Pulmonary/Chest: Effort normal and breath sounds normal.  Abdominal: Soft. There is no tenderness.  Neurological: He is alert.  Skin: Skin is warm and dry.  Psychiatric: He has a normal mood and affect.  Nursing note and vitals reviewed.    ED Treatments / Results  Labs (all labs ordered are listed, but only abnormal results are displayed) Labs Reviewed  URINALYSIS, ROUTINE W REFLEX MICROSCOPIC - Abnormal; Notable for the following:       Result Value   Color, Urine AMBER (*)    APPearance HAZY (*)    Specific Gravity, Urine 1.031 (*)    Ketones, ur 20 (*)    Protein, ur 30 (*)    Leukocytes, UA SMALL (*)    Squamous Epithelial / LPF 0-5 (*)    All other components within normal limits  BASIC METABOLIC PANEL - Abnormal; Notable for the following:    Potassium 3.2 (*)    All other components within normal limits    Radiology Ct Renal Stone Study  Result Date: 04/30/2016 CLINICAL DATA:  Right flank pain.  History kidney stones. EXAM: CT ABDOMEN AND PELVIS WITHOUT CONTRAST TECHNIQUE: Multidetector CT imaging of the abdomen and pelvis was performed following the standard protocol without IV contrast. COMPARISON:  None. FINDINGS: Lower chest: A few scattered nodules are seen in the left lung base such as peripherally on series 6, image 25 and a cluster of nodules on series 6, images 9 and 10. The largest nodule on image 9 measures 4 mm. No other abnormalities in the lung bases. Hepatobiliary: Low-attenuation adjacent to the falciform ligament is likely focal fatty deposition. The liver and gallbladder are otherwise normal. Pancreas: Unremarkable. No pancreatic ductal dilatation or surrounding inflammatory changes. Spleen: Normal in size without focal abnormality. Adrenals/Urinary Tract: The adrenal glands are normal. No renal stones are identified. No hydronephrosis or perinephric stranding. The ureters are nondilated with no ureteral stones identified. High attenuation foci in  the right pelvis on series 2, images 60 and 64 appear to be separate from the ureter and may be clips from previous appendectomy. Stomach/Bowel: The stomach and small bowel are normal. The colon is unremarkable. The patient may be status post appendectomy. Recommend clinical correlation. An abnormal appendix is not seen to suggest appendicitis. Vascular/Lymphatic: No significant vascular findings are present. No enlarged abdominal or pelvic lymph nodes. Reproductive: Prostate is unremarkable. Other: No abdominal wall hernia or abnormality. No abdominopelvic ascites. Musculoskeletal: No acute or significant osseous findings. IMPRESSION: 1. No cause for the patient's right flank pain identified. No renal or ureteral stones. 2. Scatter nodules in the left lung base with the largest measuring 4 mm. No follow-up needed if patient is low-risk (and has no known or suspected primary neoplasm). Non-contrast chest CT can be considered in 12 months if patient is high-risk. This recommendation follows the consensus statement: Guidelines for Management  of Incidental Pulmonary Nodules Detected on CT Images: From the Fleischner Society 2017; Radiology 2017; [id number]. Electronically Signed   By: Gerome Samavid  Williams III M.D   On: 04/30/2016 20:06    Procedures Procedures (including critical care time)  Medications Ordered in ED Medications  ketorolac (TORADOL) 30 MG/ML injection 30 mg (30 mg Intravenous Refused 04/30/16 1943)  naproxen (NAPROSYN) tablet 500 mg (500 mg Oral Refused 04/30/16 2055)  sodium chloride 0.9 % bolus 1,000 mL (0 mLs Intravenous Stopped 04/30/16 2113)  traMADol (ULTRAM) tablet 50 mg (50 mg Oral Given 04/30/16 2054)     Initial Impression / Assessment and Plan / ED Course  I have reviewed the triage vital signs and the nursing notes.  Pertinent labs & imaging results that were available during my care of the patient were reviewed by me and considered in my medical decision making (see chart for  details).  Clinical Course    Patient seen and examined. Work-up initiated. Medications ordered. CT ordered.   Vital signs reviewed and are as follows: BP 119/85 (BP Location: Left Arm)   Pulse 116   Temp 97.7 F (36.5 C) (Oral)   Resp 18   Ht 6' (1.829 m)   Wt 65 kg   SpO2 100%   BMI 19.44 kg/m   Patient states that he is allergic to Toradol. Held for CT results.   9:17 PM CT results reviewed, patient updated. He was unable to urinate at first however was able to provide a specimen. He was offered naproxen and tramadol. States that he is allergic to these as well. Per nurse, states that he is not allergic to oxycodone.  Discussed results with patient. Discussed that I would not be giving him narcotics without having a definitive diagnosis tonight. He then states that "tramadol will be fine". States that he does not want to take up anymore of our time and would like to be discharged. I asked if there was anything else going on or anything he would like to discuss, and he states no.   Will discharge patient to home. Encouraged him to use any over-the-counter medications that he can tolerate or use heat on the area. Encouraged return with worsening symptoms or other concerns.  Final Clinical Impressions(s) / ED Diagnoses   Final diagnoses:  Right flank pain   Patient presents with flank pain. States he has a history of kidney stones. Patient is on Adderall, benzodiazepines. Previously on Suboxone. CT is negative for kidney stone or other acute painful condition. Patient was exhibiting narcotic seeking behavior in emergency department tonight. Discharged home. UA does not demonstrate infection.   New Prescriptions Discharge Medication List as of 04/30/2016  8:56 PM       Renne CriglerJoshua Anny Sayler, PA-C 04/30/16 2121    Donnetta HutchingBrian Cook, MD 05/06/16 [phone]
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
## 2016-05-01 ENCOUNTER — Emergency Department (HOSPITAL_COMMUNITY)
Admission: EM | Admit: 2016-05-01 | Discharge: 2016-05-01 | Disposition: A | Discharge status: Home / Self Care | Payer: Self-pay | Attending: Emergency Medicine | Admitting: Emergency Medicine

## 2016-05-01 DIAGNOSIS — S39012A Strain of muscle, fascia and tendon of lower back, initial encounter: Secondary | ICD-10-CM

## 2016-05-01 DIAGNOSIS — S161XXA Strain of muscle, fascia and tendon at neck level, initial encounter: Secondary | ICD-10-CM

## 2016-05-01 DIAGNOSIS — R319 Hematuria, unspecified: Secondary | ICD-10-CM

## 2016-05-01 DIAGNOSIS — N390 Urinary tract infection, site not specified: Secondary | ICD-10-CM

## 2016-05-01 LAB — COMPREHENSIVE METABOLIC PANEL
ALT: 22 U/L (ref 17–63)
AST: 22 U/L (ref 15–41)
Albumin: 4.5 g/dL (ref 3.5–5.0)
Alkaline Phosphatase: 48 U/L (ref 38–126)
Anion gap: 10 (ref 5–15)
BUN: 13 mg/dL (ref 6–20)
CHLORIDE: 106 mmol/L (ref 101–111)
CO2: 25 mmol/L (ref 22–32)
CREATININE: 1.02 mg/dL (ref 0.61–1.24)
Calcium: 9.7 mg/dL (ref 8.9–10.3)
Glucose, Bld: 108 mg/dL — ABNORMAL HIGH (ref 65–99)
POTASSIUM: 3.7 mmol/L (ref 3.5–5.1)
Sodium: 141 mmol/L (ref 135–145)
TOTAL PROTEIN: 7.3 g/dL (ref 6.5–8.1)
Total Bilirubin: 1.2 mg/dL (ref 0.3–1.2)

## 2016-05-01 LAB — URINALYSIS, ROUTINE W REFLEX MICROSCOPIC
Bilirubin Urine: NEGATIVE
GLUCOSE, UA: NEGATIVE mg/dL
Hgb urine dipstick: NEGATIVE
KETONES UR: 20 mg/dL — AB
NITRITE: NEGATIVE
Protein, ur: 100 mg/dL — AB
SPECIFIC GRAVITY, URINE: 1.025 (ref 1.005–1.030)
Squamous Epithelial / LPF: NONE SEEN
pH: 5 (ref 5.0–8.0)

## 2016-05-01 LAB — LIPASE, BLOOD: LIPASE: 23 U/L (ref 11–51)

## 2016-05-01 MED ORDER — CEPHALEXIN 500 MG PO CAPS: 500.0000 mg | ORAL_CAPSULE | Freq: Four times a day (QID) | ORAL | 0 refills | Status: DC

## 2016-05-01 MED ORDER — STERILE WATER FOR INJECTION IJ SOLN
INTRAMUSCULAR | Status: AC
  Filled 2016-05-01: qty 10

## 2016-05-01 MED ORDER — CYCLOBENZAPRINE HCL 10 MG PO TABS
5.0000 mg | ORAL_TABLET | Freq: Once | ORAL | Status: AC
  Administered 2016-05-01: 5 mg via ORAL
  Filled 2016-05-01: qty 1

## 2016-05-01 MED ORDER — CYCLOBENZAPRINE HCL 5 MG PO TABS: 5.0000 mg | ORAL_TABLET | Freq: Two times a day (BID) | ORAL | 0 refills | Status: DC | PRN

## 2016-05-01 MED ORDER — CEFTRIAXONE SODIUM 250 MG IJ SOLR
250.0000 mg | Freq: Once | INTRAMUSCULAR | Status: AC
  Administered 2016-05-01: 250 mg via INTRAMUSCULAR
  Filled 2016-05-01: qty 250

## 2016-05-01 MED ORDER — OXYCODONE-ACETAMINOPHEN 5-325 MG PO TABS
1.0000 | ORAL_TABLET | Freq: Once | ORAL | Status: AC
  Administered 2016-05-01: 1 via ORAL
  Filled 2016-05-01: qty 1

## 2016-05-01 MED ORDER — AZITHROMYCIN 250 MG PO TABS
1000.0000 mg | ORAL_TABLET | Freq: Once | ORAL | Status: AC
  Administered 2016-05-01: 1000 mg via ORAL
  Filled 2016-05-01: qty 4
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
## 2016-05-01 NOTE — Discharge Instructions (Signed)
Please take all of your antibiotics until finished!  Flexeril is your muscle relaxer to take as needed. Ice affected area for additional pain relief.  Return to ER for new or worsening symptoms, any additional concerns.

## 2016-05-01 NOTE — ED Notes (Signed)
Pt will not wait, pt states he needs to leave and was able to obtain vitals

## 2016-05-01 NOTE — ED Notes (Signed)
Pt attempting to leave convinced to stay to get medications, pt stable and ambulatory
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
## 2016-05-02 LAB — GC/CHLAMYDIA PROBE AMP (~~LOC~~) NOT AT ARMC
Chlamydia: NEGATIVE
NEISSERIA GONORRHEA: NEGATIVE

## 2016-11-08 ENCOUNTER — Emergency Department (HOSPITAL_COMMUNITY)
Admission: EM | Admit: 2016-11-08 | Discharge: 2016-11-09 | Discharge status: Against Medical Advice | Payer: 59 | Attending: Emergency Medicine | Admitting: Emergency Medicine

## 2016-11-08 ENCOUNTER — Encounter (HOSPITAL_COMMUNITY): Payer: Self-pay | Admitting: *Deleted

## 2016-11-08 DIAGNOSIS — R319 Hematuria, unspecified: Secondary | ICD-10-CM | POA: Diagnosis not present

## 2016-11-08 DIAGNOSIS — Z79899 Other long term (current) drug therapy: Secondary | ICD-10-CM | POA: Insufficient documentation

## 2016-11-08 DIAGNOSIS — J45909 Unspecified asthma, uncomplicated: Secondary | ICD-10-CM | POA: Diagnosis not present

## 2016-11-08 DIAGNOSIS — R109 Unspecified abdominal pain: Secondary | ICD-10-CM

## 2016-11-08 DIAGNOSIS — I10 Essential (primary) hypertension: Secondary | ICD-10-CM | POA: Insufficient documentation

## 2016-11-08 DIAGNOSIS — Z87891 Personal history of nicotine dependence: Secondary | ICD-10-CM | POA: Insufficient documentation

## 2016-11-08 DIAGNOSIS — R1084 Generalized abdominal pain: Secondary | ICD-10-CM | POA: Insufficient documentation

## 2016-11-08 LAB — LIPASE, BLOOD: LIPASE: 22 U/L (ref 11–51)

## 2016-11-08 LAB — COMPREHENSIVE METABOLIC PANEL
ALT: 44 U/L (ref 17–63)
AST: 63 U/L — AB (ref 15–41)
Albumin: 4.4 g/dL (ref 3.5–5.0)
Alkaline Phosphatase: 78 U/L (ref 38–126)
Anion gap: 9 (ref 5–15)
BILIRUBIN TOTAL: 0.7 mg/dL (ref 0.3–1.2)
BUN: 7 mg/dL (ref 6–20)
CO2: 28 mmol/L (ref 22–32)
CREATININE: 1.08 mg/dL (ref 0.61–1.24)
Calcium: 9.4 mg/dL (ref 8.9–10.3)
Chloride: 103 mmol/L (ref 101–111)
GFR calc Af Amer: >60 mL/min (ref >60)
Glucose, Bld: 88 mg/dL (ref 65–99)
POTASSIUM: 3.5 mmol/L (ref 3.5–5.1)
Sodium: 140 mmol/L (ref 135–145)
TOTAL PROTEIN: 7.1 g/dL (ref 6.5–8.1)

## 2016-11-08 LAB — URINALYSIS, ROUTINE W REFLEX MICROSCOPIC
Bacteria, UA: NONE SEEN
GLUCOSE, UA: NEGATIVE mg/dL
Ketones, ur: 5 mg/dL — AB
Leukocytes, UA: NEGATIVE
NITRITE: NEGATIVE
Protein, ur: 100 mg/dL — AB
SPECIFIC GRAVITY, URINE: 1.025 (ref 1.005–1.030)
pH: 5 (ref 5.0–8.0)

## 2016-11-08 LAB — CBC
HCT: 41 % (ref 39.0–52.0)
Hemoglobin: 13.4 g/dL (ref 13.0–17.0)
MCH: 29.6 pg (ref 26.0–34.0)
MCHC: 32.7 g/dL (ref 30.0–36.0)
MCV: 90.5 fL (ref 78.0–100.0)
PLATELETS: 236 10*3/uL (ref 150–400)
RBC: 4.53 MIL/uL (ref 4.22–5.81)
RDW: 12.9 % (ref 11.5–15.5)
WBC: 8.9 10*3/uL (ref 4.0–10.5)

## 2016-11-08 NOTE — ED Triage Notes (Signed)
Pt arrives with c/o bilateral flank pain and thoracic pain. Pt states he has a hx of kidney stones and is having painful urination/hesitancy as well as hematuria.

## 2016-11-08 NOTE — ED Notes (Signed)
Pt asking if he can be rushed back to a room, or if he can have pain meds. Offered tylenol or motrin, pt refused.

## 2016-11-09 LAB — RAPID URINE DRUG SCREEN, HOSP PERFORMED
AMPHETAMINES: POSITIVE — AB
BARBITURATES: NOT DETECTED
BENZODIAZEPINES: POSITIVE — AB
COCAINE: NOT DETECTED
Opiates: POSITIVE — AB
TETRAHYDROCANNABINOL: NOT DETECTED

## 2016-11-09 MED ORDER — CEPHALEXIN 500 MG PO CAPS: 500.0000 mg | ORAL_CAPSULE | Freq: Three times a day (TID) | ORAL | 0 refills | Status: DC

## 2016-11-09 MED ORDER — CEPHALEXIN 250 MG PO CAPS
500.0000 mg | ORAL_CAPSULE | Freq: Once | ORAL | Status: AC
  Administered 2016-11-09: 500 mg via ORAL
  Filled 2016-11-09: qty 2

## 2016-11-09 MED ORDER — IBUPROFEN 600 MG PO TABS: 600.0000 mg | ORAL_TABLET | Freq: Three times a day (TID) | ORAL | 0 refills | Status: DC | PRN

## 2016-11-09 MED ORDER — KETOROLAC TROMETHAMINE 30 MG/ML IJ SOLN
30.0000 mg | Freq: Once | INTRAMUSCULAR | Status: AC
  Administered 2016-11-09: 30 mg via INTRAVENOUS
  Filled 2016-11-09: qty 1

## 2016-11-09 NOTE — ED Notes (Addendum)
EDP at the bedside.  ?

## 2016-11-09 NOTE — ED Provider Notes (Signed)
[facility]-EMERGENCY DEPT Provider Note   CSN: [id number] Arrival date & time: 11/08/16  2114  By signing my name below, I, Ethan Forbes, attest that this documentation has been prepared under the direction and in the presence of Azalia Bilisampos, Nolton Denis, MD. Electronically Signed: Thelma Bargeick Forbes, Scribe. 11/09/16. 12:57 AM.  History   Chief Complaint Chief Complaint  Patient presents with  . Flank Pain  . Hematuria   The history is provided by the patient. No language interpreter was used.    HPI Comments: Ethan Forbes is a 31 y.o. male with PMHx of nephrolithiasis who presents to the Emergency Department complaining of constant, gradually worsening hematuria since 3-4 days. He has associated increased frequency and dysuria. Pt notes his last diagnosis of nephrolithiasis was 2 years ago and he went to UC, was given Flomax, pain medications, and Abx, and his symptoms resolved.  He also complains of left-sided back pain after injuring himself at work when he states he lifted something heavy. He works at [employer].  Past Medical History:  Diagnosis Date  . ADHD (attention deficit hyperactivity disorder)   . Allergic rhinitis   . Asthma   . Attention deficit hyperactivity disorder   . Depression   . Hypertension   . Tachycardia     Patient Active Problem List   Diagnosis Date Noted  . Tachycardia 07/15/2013  . Generalized anxiety disorder   . ADD (attention deficit disorder)   . Unintentional weight loss     Past Surgical History:  Procedure Laterality Date  . TONSILLECTOMY         Home Medications    Prior to Admission medications   Medication Sig Start Date End Date Taking? Authorizing Provider  cephALEXin (KEFLEX) 500 MG capsule Take 1 capsule (500 mg total) by mouth 4 (four) times daily. 05/01/16   Ward, Chase PicketJaime Pilcher, PA-C  cyclobenzaprine (FLEXERIL) 5 MG tablet Take 1 tablet (5 mg total) by mouth 2 (two) times daily as needed for muscle spasms. 05/01/16   Ward, Chase PicketJaime  Pilcher, PA-C  dextroamphetamine (DEXTROSTAT) 10 MG tablet Take 10 mg by mouth daily.    [provider]    Family History Family History  Problem Relation Age of Onset  . Heart failure Other   . Diabetes Other   . Hypertension Other     Social History Social History  Substance Use Topics  . Smoking status: Current Every Day Smoker    Packs/day: 0.50  . Smokeless tobacco: Never Used  . Alcohol use Yes     Comment: rare     Allergies   Codeine; Hydromet [hydrocodone-homatropine]; Ativan [lorazepam]; and Robaxin [methocarbamol]   Review of Systems Review of Systems  Genitourinary: Positive for dysuria, frequency and hematuria.  Musculoskeletal: Positive for back pain.  All other systems reviewed and are negative.    Physical Exam Updated Vital Signs BP 125/82 (BP Location: Left Arm)   Pulse (!) 113   Temp 98.2 F (36.8 C) (Oral)   Resp 19   Ht 6' (1.829 m)   Wt 151 lb 7 oz (68.7 kg)   SpO2 100%   BMI 20.54 kg/m   Physical Exam  Constitutional: He is oriented to person, place, and time. He appears well-developed and well-nourished.  HENT:  Head: Normocephalic and atraumatic.  Eyes: EOM are normal.  Neck: Normal range of motion.  Cardiovascular: Normal rate, regular rhythm, normal heart sounds and intact distal pulses.   Pulmonary/Chest: Effort normal and breath sounds normal. No respiratory distress.  Abdominal: Soft. He exhibits no distension. There is no tenderness.  Musculoskeletal: Normal range of motion.  Neurological: He is alert and oriented to person, place, and time.  Skin: Skin is warm and dry.  Psychiatric: He has a normal mood and affect. Judgment normal.  Nursing note and vitals reviewed.    ED Treatments / Results  DIAGNOSTIC STUDIES: Oxygen Saturation is 100% on RA, normal by my interpretation.    COORDINATION OF CARE: 12:57 AM Discussed treatment plan with pt at bedside and pt agreed to plan.  Labs (all labs ordered are  listed, but only abnormal results are displayed) Labs Reviewed  COMPREHENSIVE METABOLIC PANEL - Abnormal; Notable for the following:       Result Value   AST 63 (*)    All other components within normal limits  URINALYSIS, ROUTINE W REFLEX MICROSCOPIC - Abnormal; Notable for the following:    Color, Urine AMBER (*)    APPearance HAZY (*)    Hgb urine dipstick LARGE (*)    Bilirubin Urine SMALL (*)    Ketones, ur 5 (*)    Protein, ur 100 (*)    Squamous Epithelial / LPF 0-5 (*)    All other components within normal limits  CBC  LIPASE, BLOOD    EKG  EKG Interpretation None       Radiology No results found.  Procedures Procedures (including critical care time)  Medications Ordered in ED Medications - No data to display   Initial Impression / Assessment and Plan / ED Course  I have reviewed the triage vital signs and the nursing notes.  Pertinent labs & imaging results that were available during my care of the patient were reviewed by me and considered in my medical decision making (see chart for details).      1:28 AM Pt does not want to stay for his CT. Started on abx, urine culture sent. AMA discussion had regarding inability to fully understand pathology. Risks discussed including death and worsening pathology.  Understands he is welcome to return.   Final Clinical Impressions(s) / ED Diagnoses   Final diagnoses:  None    New Prescriptions Discharge Medication List as of 11/09/2016  1:26 AM    START taking these medications   Details  ibuprofen (ADVIL,MOTRIN) 600 MG tablet Take 1 tablet (600 mg total) by mouth every 8 (eight) hours as needed., Starting Wed 11/09/2016, Print       I personally performed the services described in this documentation, which was scribed in my presence. The recorded information has been reviewed and is accurate.        Azalia Bilis, MD 11/09/16 [phone]

## 2016-11-09 NOTE — ED Notes (Signed)
Pt left against medical advice. States this is taking so long I can't wait for that CT, pt trying to leave ED with the IV in place, pt advise that he can go with the ED IV. IV removed by RN.

## 2016-11-10 LAB — URINE CULTURE: CULTURE: NO GROWTH

## 2016-12-16 ENCOUNTER — Encounter (HOSPITAL_COMMUNITY): Payer: Self-pay

## 2016-12-16 ENCOUNTER — Inpatient Hospital Stay
Admission: AD | Admit: 2016-12-16 | Discharge: 2016-12-21 | DRG: 885 | Disposition: A | Discharge status: Home / Self Care | Payer: No Typology Code available for payment source | Attending: Psychiatry | Admitting: Psychiatry

## 2016-12-16 ENCOUNTER — Emergency Department (HOSPITAL_COMMUNITY)
Admission: EM | Admit: 2016-12-16 | Discharge: 2016-12-16 | Disposition: A | Discharge status: Other Acute Inpatient Hospital | Payer: 59 | Attending: Emergency Medicine | Admitting: Emergency Medicine

## 2016-12-16 DIAGNOSIS — Z9889 Other specified postprocedural states: Secondary | ICD-10-CM

## 2016-12-16 DIAGNOSIS — Z8249 Family history of ischemic heart disease and other diseases of the circulatory system: Secondary | ICD-10-CM | POA: Diagnosis not present

## 2016-12-16 DIAGNOSIS — F17210 Nicotine dependence, cigarettes, uncomplicated: Secondary | ICD-10-CM | POA: Diagnosis present

## 2016-12-16 DIAGNOSIS — F411 Generalized anxiety disorder: Secondary | ICD-10-CM | POA: Diagnosis present

## 2016-12-16 DIAGNOSIS — Z888 Allergy status to other drugs, medicaments and biological substances status: Secondary | ICD-10-CM

## 2016-12-16 DIAGNOSIS — Z833 Family history of diabetes mellitus: Secondary | ICD-10-CM

## 2016-12-16 DIAGNOSIS — F329 Major depressive disorder, single episode, unspecified: Secondary | ICD-10-CM | POA: Diagnosis present

## 2016-12-16 DIAGNOSIS — J45909 Unspecified asthma, uncomplicated: Secondary | ICD-10-CM | POA: Diagnosis present

## 2016-12-16 DIAGNOSIS — F909 Attention-deficit hyperactivity disorder, unspecified type: Secondary | ICD-10-CM | POA: Insufficient documentation

## 2016-12-16 DIAGNOSIS — I10 Essential (primary) hypertension: Secondary | ICD-10-CM | POA: Diagnosis present

## 2016-12-16 DIAGNOSIS — Z886 Allergy status to analgesic agent status: Secondary | ICD-10-CM

## 2016-12-16 DIAGNOSIS — F332 Major depressive disorder, recurrent severe without psychotic features: Principal | ICD-10-CM | POA: Diagnosis present

## 2016-12-16 DIAGNOSIS — F17200 Nicotine dependence, unspecified, uncomplicated: Secondary | ICD-10-CM

## 2016-12-16 DIAGNOSIS — R45851 Suicidal ideations: Secondary | ICD-10-CM | POA: Insufficient documentation

## 2016-12-16 LAB — CBC
HEMATOCRIT: 41.4 % (ref 39.0–52.0)
Hemoglobin: 14 g/dL (ref 13.0–17.0)
MCH: 30.3 pg (ref 26.0–34.0)
MCHC: 33.8 g/dL (ref 30.0–36.0)
MCV: 89.6 fL (ref 78.0–100.0)
PLATELETS: 260 10*3/uL (ref 150–400)
RBC: 4.62 MIL/uL (ref 4.22–5.81)
RDW: 13.7 % (ref 11.5–15.5)
WBC: 8.6 10*3/uL (ref 4.0–10.5)

## 2016-12-16 LAB — COMPREHENSIVE METABOLIC PANEL
ALBUMIN: 4.6 g/dL (ref 3.5–5.0)
ALT: 15 U/L — ABNORMAL LOW (ref 17–63)
AST: 20 U/L (ref 15–41)
Alkaline Phosphatase: 64 U/L (ref 38–126)
Anion gap: 10 (ref 5–15)
BILIRUBIN TOTAL: 0.5 mg/dL (ref 0.3–1.2)
BUN: 14 mg/dL (ref 6–20)
CHLORIDE: 104 mmol/L (ref 101–111)
CO2: 26 mmol/L (ref 22–32)
CREATININE: 0.93 mg/dL (ref 0.61–1.24)
Calcium: 9.2 mg/dL (ref 8.9–10.3)
GFR calc Af Amer: >60 mL/min (ref >60)
GLUCOSE: 91 mg/dL (ref 65–99)
Potassium: 3.5 mmol/L (ref 3.5–5.1)
Sodium: 140 mmol/L (ref 135–145)
TOTAL PROTEIN: 7.7 g/dL (ref 6.5–8.1)

## 2016-12-16 LAB — RAPID URINE DRUG SCREEN, HOSP PERFORMED
AMPHETAMINES: POSITIVE — AB
BENZODIAZEPINES: POSITIVE — AB
Barbiturates: NOT DETECTED
COCAINE: NOT DETECTED
Opiates: NOT DETECTED
Tetrahydrocannabinol: NOT DETECTED

## 2016-12-16 LAB — ACETAMINOPHEN LEVEL: Acetaminophen (Tylenol), Serum: <10 ug/mL — ABNORMAL LOW (ref 10–30)

## 2016-12-16 LAB — ETHANOL: ALCOHOL ETHYL (B): 54 mg/dL — AB (ref <5)

## 2016-12-16 LAB — SALICYLATE LEVEL: Salicylate Lvl: <7 mg/dL (ref 2.8–30.0)

## 2016-12-16 MED ORDER — TRAZODONE HCL 50 MG PO TABS: 50.0000 mg | ORAL_TABLET | Freq: Every evening | ORAL | Status: DC | PRN

## 2016-12-16 MED ORDER — LAMOTRIGINE 25 MG PO TABS
25.0000 mg | ORAL_TABLET | Freq: Every day | ORAL | Status: DC
  Filled 2016-12-16: qty 1

## 2016-12-16 MED ORDER — SODIUM CHLORIDE 0.9 % IV BOLUS (SEPSIS)
1000.0000 mL | Freq: Once | INTRAVENOUS | Status: AC
  Administered 2016-12-16: 1000 mL via INTRAVENOUS

## 2016-12-16 MED ORDER — NALOXONE HCL 2 MG/2ML IJ SOSY
2.0000 mg | PREFILLED_SYRINGE | Freq: Once | INTRAMUSCULAR | Status: AC
  Administered 2016-12-16: 2 mg via INTRAVENOUS
  Filled 2016-12-16: qty 2

## 2016-12-16 NOTE — [facility] Assessment (Signed)
[facility] Assessment Progress Note  Case was staffed with Lord DNP, Akintayo MD who recommended a inpatient admission as appropriate bed placement is investigated.      

## 2016-12-16 NOTE — Progress Notes (Signed)
12/16/2016 Approximately 1:38pm Intern called pt's name two times to offer activities. Pt did not respond and appeared to be sleeping.  Marvell Fullerachel Meyer, Recreation Therapy Intern    Caroll RancherMarjette Camila Maita, LRT/CTRS

## 2016-12-16 NOTE — ED Notes (Signed)
Pt oriented to room and unit.  Pt has very blunted affect and seems slow to respond.  Pt has asked multiple times when he could leave.  Pt has been instructed that he is under involuntary commitment and cannot leave.  Pt seems irritable about having to stay.  He denies S/I, H/I, and AVH.  15 minute checks and video monitoring in place.
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
## 2016-12-16 NOTE — ED Provider Notes (Signed)
Care assumed from previous provider PA Leaphart. Please see note for further details. Case discussed, plan agreed upon. Per prior PA, poison control recommends observation for 6 hours. At 6 hour mark, if vitals remain stable and no acute events, will consult TTS. Dispo per TTS recommendations.   TTS recommending inpatient treatment. Holding orders placed. Patient not on home medications.    Ward, Chase PicketJaime Pilcher, PA-C 12/16/16 1051

## 2016-12-16 NOTE — ED Notes (Signed)
[facility]  All belongings sent with pt.  Pt was calm and cooperative.

## 2016-12-16 NOTE — Tx Team (Signed)
Initial Treatment Plan 12/16/2016 6:24 PM Ethan Forbes [id number]    PATIENT STRESSORS: Financial difficulties Substance abuse   PATIENT STRENGTHS: General fund of knowledge Work skills   PATIENT IDENTIFIED PROBLEMS: Suicide attempt by overdose  Risky behaviors                   DISCHARGE CRITERIA:  Improved stabilization in mood, thinking, and/or behavior Verbal commitment to aftercare and medication compliance  PRELIMINARY DISCHARGE PLAN: Attend aftercare/continuing care group Outpatient therapy  PATIENT/FAMILY INVOLVEMENT: This treatment plan has been presented to and reviewed with the patient, Ethan Forbes.  The patient and family have been given the opportunity to ask questions and make suggestions.  Ethan PolingSunny  Kayci Belleville, RN 12/16/2016, 6:24 PM

## 2016-12-16 NOTE — ED Notes (Signed)
EKG given to EDP,Palumbo,MD., for review. 
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
## 2016-12-16 NOTE — Progress Notes (Signed)
Patient spent most of the shift resting in the bed quietly. No issues to report on shift thus far. He appears to be in the bed resting quietly. Will continue to monitor.

## 2016-12-16 NOTE — [facility] Group Notes (Signed)
[facility] Group Notes:  (Nursing/MHT/Case Management/Adjunct)  Date:  12/16/2016  Time:  9:28 PM  Type of Therapy:  Evening Wrap-up Group  Participation Level:  Did Not Attend  Participation Quality:  N/A  Affect:  N/A  Cognitive:  N/A  Insight:  None  Engagement in Group:  Did Not Attend  Modes of Intervention:  Discussion  Summary of Progress/Problems:  Ethan MorrowChelsea Forbes Ethan Forbes 12/16/2016, 9:28 PM

## 2016-12-16 NOTE — Plan of Care (Signed)
Problem: Health Behavior/Discharge Planning: Goal: Identification of resources available to assist in meeting health care needs will improve Outcome: Not Progressing New Admission

## 2016-12-16 NOTE — BH Assessment (Signed)
[facility] Assessment Progress Note  Per Thedore MinsMojeed Akintayo, MD, this pt requires psychiatric hospitalization.  Malva LimesLinsey Strader, RN, [facility] that [facility] by Dr Ardyth HarpsHernandez to Rm [id number]; they will be ready to receive pt after 19:30 or 20:00.  Pt presents under IVC initiated by his roommate, and upheld by Dr Jannifer FranklinAkintayo, and IVC documents have been faxed to [phone].  Pt's nurse, Kendal Hymendie, has been notified, and agrees to call report to [phone].  [facility]   Doylene Canninghomas Gabriell Casimir, MA Triage Specialist [phone]

## 2016-12-16 NOTE — ED Notes (Signed)
Bed: WTR6 Expected date:  Expected time:  Means of arrival:  Comments: 

## 2016-12-16 NOTE — ED Notes (Signed)
Report called to [facility] 
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
## 2016-12-16 NOTE — ED Notes (Signed)
Ethan Forbes TTS at bedside.

## 2016-12-17 DIAGNOSIS — F329 Major depressive disorder, single episode, unspecified: Secondary | ICD-10-CM | POA: Diagnosis present

## 2016-12-17 MED ORDER — ALPRAZOLAM 0.5 MG PO TABS: 0.5000 mg | ORAL_TABLET | Freq: Two times a day (BID) | ORAL | Status: DC | PRN

## 2016-12-17 MED ORDER — TRAZODONE HCL 50 MG PO TABS: 50.0000 mg | ORAL_TABLET | Freq: Every evening | ORAL | Status: DC | PRN

## 2016-12-17 MED ORDER — ACETAMINOPHEN 325 MG PO TABS: 650.0000 mg | ORAL_TABLET | Freq: Four times a day (QID) | ORAL | Status: DC | PRN

## 2016-12-17 MED ORDER — ALUM & MAG HYDROXIDE-SIMETH 200-200-20 MG/5ML PO SUSP: 30.0000 mL | ORAL | Status: DC | PRN

## 2016-12-17 MED ORDER — MAGNESIUM HYDROXIDE 400 MG/5ML PO SUSP: 30.0000 mL | Freq: Every day | ORAL | Status: DC | PRN

## 2016-12-17 MED ORDER — VENLAFAXINE HCL ER 37.5 MG PO CP24
37.5000 mg | ORAL_CAPSULE | Freq: Every day | ORAL | Status: DC
  Filled 2016-12-17 (×2): qty 1

## 2016-12-17 NOTE — Plan of Care (Signed)
Problem: Safety: Goal: Periods of time without injury will increase Outcome: Progressing Patient remains safe and without injury during hospitalization and on Q 15 minute observation. Will continue to monitor patient.    
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
## 2016-12-17 NOTE — [facility] Suicide Risk Assessment (Signed)
[facility] INPATIENT:  Family/Significant Other Suicide Prevention Education  Suicide Prevention Education:  Patient Refusal for Family/Significant Other Suicide Prevention Education: The patient Ethan Forbes has refused to provide written consent for family/significant other to be provided Family/Significant Other Suicide Prevention Education during admission and/or prior to discharge.  Physician notified.  Cathalina Barcia G. Garnette CzechSampson MSW, LCSWA 12/17/2016, 4:36 PM
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
## 2016-12-17 NOTE — Progress Notes (Signed)
Patient was visible on the unit was visible at time pacing the hall. Patient appeared anxious, but remained calm and was cooperative. Doctor order Effexor, but patient refuses, stating that he will be leaving soon, don't see the need to take the medication. Patient is alert and oriented x 4, breathing unlabored, and extremities x 4 within normal limits. Patient is calm and cooperative. Patient did not display any disruptive behavior. Patient continues to be monitored on 15 minute safety checks. Will continue to monitor patient and notify MD of any changes.
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
## 2016-12-17 NOTE — [facility] Group Notes (Signed)
[facility] LCSW Group Therapy  12/17/2016 1:58 PM  Type of Therapy:  Group Therapy  Participation Level:  Patient did not attend group. CSW invited patient to group.   Summary of Progress/Problems: Stress management: Patients defined and discussed the topic of stress and the related symptoms and triggers for stress. Patients identified healthy coping skills they would like to try during hospitalization and after discharge to manage stress in a healthy way. CSW offered insight to varying stress management techniques.   Iyah Laguna G. Garnette CzechSampson MSW, LCSWA 12/17/2016, 1:59 PM

## 2016-12-18 NOTE — [facility] Group Notes (Signed)
[facility] LCSW Group Therapy  12/18/2016 2:39 PM  Type of Therapy:  Group Therapy  Participation Level:  Active  Participation Quality:  Attentive  Affect:  Appropriate  Cognitive:  Alert  Insight:  Improving  Engagement in Therapy:  Improving  Modes of Intervention:  Activity, Discussion, Education, Exploration, Problem-solving, Reality Testing and Support  Summary of Progress/Problems: Self esteem: Patients discussed self esteem and how it impacts them. They discussed what aspects in their lives has influenced their self esteem. They were challenged to identify changes that are needed in order to improve self esteem. Patients participated in activity where they had to identify positive adjectives they felt described their personality. Patients shared with the group on the following areas: Things I am good at, What I like about my appearance, I've helped others by, What I value the most, compliments I have received, challenges I have overcome, thing that make me unique, and Times I've made others happy. Pt states he is trying to have a positive outlook on his situation. Pt acknowledged that he has not been coping with his stress. CSW provided support to patient.   Delisia Mcquiston G. Garnette CzechSampson MSW, LCSWA 12/18/2016, 2:42 PM
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
## 2016-12-18 NOTE — Progress Notes (Signed)
Nutrition Brief Note  Patient identified on the Malnutrition Screening Tool (MST) Report  Wt Readings from Last 15 Encounters:  12/16/16 142 lb (64.4 kg)  11/08/16 151 lb 7 oz (68.7 kg)  04/30/16 147 lb (66.7 kg)  04/30/16 143 lb 5 oz (65 kg)  02/18/13 125 lb 12.8 oz (57.1 kg)  01/23/13 126 lb 6.4 oz (57.3 kg)   Met with patient. He reports his appetite is fine. Reports he just "does not like hospital food." He reports he is eating well here. Per chart patient consuming 0% of meals, but pt denies this. He reports he was eating well PTA, as well. He also denies any weight loss. Reports he weighs 150 lbs currently, which is his UBW. Current weight in chart does not appear to be true measured weight, so it may be inaccurate. Patient reports he does not need any nutrition interventions at this time. No nutrition questions/concerns at this time.  Body mass index is 19.26 kg/m. Patient meets criteria for normal weight based on current BMI.   Current diet order is Regular, patient is consuming approximately 0% of meals at this time (though patient reports this is not the case and he is eating well). Labs and medications reviewed.   No nutrition interventions warranted at this time. If nutrition issues arise, please consult RD.   Willey Blade, MS, RD, LDN Pager: [phone] After Hours Pager: [phone]

## 2016-12-18 NOTE — Plan of Care (Signed)
Problem: Self-Concept: Goal: Ability to disclose and discuss suicidal ideas will improve Outcome: Progressing Pt disclosed not being suicidal.

## 2016-12-18 NOTE — Progress Notes (Addendum)
Pt was in a pleasant mood. Pt continues to be isolative. Denies depression, SI, HI or A/V hallucinations. Pt denies pain or discomfort. No distress noted. 15 min safety checks continue. Pt slept 8 hrs.

## 2016-12-18 NOTE — Progress Notes (Signed)
Patient was visible on the unit at times sitting in dayroom watching TV, but keeps to self. Patient continue to refuse Effexor stating I will be leaving soon, so I don't need to start it or take it. Patient denies any SI/HI/AH/VH. Patient is alert and oriented x 4, breathing unlabored, and extremities x 4 within normal limits. Patient is calm and cooperative. Patient did not display any disruptive behavior. Patient continues to be monitored on 15 minute safety checks. Will continue to monitor patient and notify MD of any changes.

## 2016-12-18 NOTE — Progress Notes (Signed)
Pt is pleasant. Pt is isolative. Denies SI, HI, or A/V hallucinations. Pt denies being depressed. No distress noted. Pt slept 8 hrs. 15 min safety checks continues.

## 2016-12-18 NOTE — Plan of Care (Signed)
Problem: Safety: Goal: Periods of time without injury will increase Outcome: Progressing Patient remains safe and without injury during hospitalization and on Q 15 minute observations. Will continue to monitor patient.   

## 2016-12-18 NOTE — Progress Notes (Signed)
Patient ID: Ethan Forbes, male   DOB: 1985-10-26, 31 y.o.   MRN: [id number]  Patient requested to speak with CSW after group. Pt shared that he now has no place to go at discharge. Pt spoke to his ex-girlfriend who is also his roommate. Pt states she is unwilling to let patient return after his recent behavior. Pt admitted to CSW that he has been struggling with substance abuse for awhile and he recognizes that as an issue for him now. Pt reports he asked his roommate to discard of all of his xanax and he plans to no longer take xanax.   CSW discussed options for housing at discharge. CSW discussed sober living housings such as [facility], homeless shelter, and also reaching out to his parents. Patient stated that due to his previous substance use, he has estranged relationship with his parents because of this and is likely not an option for him. CSW encouraged patient to contact his parents to make them aware of the situation and see what their willingness is to help him at discharge. Patient states that he has lived in an [facility] before and did not have a good experience but is willing to give it another try. Inpatient treatment program is not an option for patient as he just started a new job about 1 week ago.   CSW provided patient with a list of [facility] houses. Patient states he will begin to call to find an [facility] house. No further questions for CSW at this time.   Shequila Neglia G. Garnette CzechSampson MSW, LCSWA 12/18/2016 2:57 PM

## 2016-12-18 NOTE — Plan of Care (Signed)
Problem: Pain Managment: Goal: General experience of comfort will improve Outcome: Progressing Pt will verbalize any pain or discomfort during the shift if applicable.

## 2016-12-19 DIAGNOSIS — F17200 Nicotine dependence, unspecified, uncomplicated: Secondary | ICD-10-CM

## 2016-12-19 DIAGNOSIS — F332 Major depressive disorder, recurrent severe without psychotic features: Principal | ICD-10-CM

## 2016-12-19 MED ORDER — BUPROPION HCL ER (SR) 100 MG PO TB12: 100.0000 mg | ORAL_TABLET | Freq: Two times a day (BID) | ORAL | 0 refills | Status: DC

## 2016-12-19 MED ORDER — NICOTINE 14 MG/24HR TD PT24
14.0000 mg | MEDICATED_PATCH | Freq: Every day | TRANSDERMAL | Status: DC
  Administered 2016-12-19 – 2016-12-21 (×3): 14 mg via TRANSDERMAL
  Filled 2016-12-19 (×3): qty 1

## 2016-12-19 MED ORDER — TRAZODONE HCL 50 MG PO TABS: 50.0000 mg | ORAL_TABLET | Freq: Every day | ORAL | 0 refills | Status: DC

## 2016-12-19 MED ORDER — BUPROPION HCL ER (SR) 100 MG PO TB12
100.0000 mg | ORAL_TABLET | Freq: Two times a day (BID) | ORAL | Status: DC
  Administered 2016-12-20 (×2): 100 mg via ORAL
  Filled 2016-12-19 (×2): qty 1

## 2016-12-19 MED ORDER — TRAZODONE HCL 50 MG PO TABS
50.0000 mg | ORAL_TABLET | Freq: Every day | ORAL | Status: DC
  Administered 2016-12-19: 50 mg via ORAL
  Filled 2016-12-19: qty 1

## 2016-12-19 NOTE — Progress Notes (Signed)
Pt calm and cooperative. He denies current SI, HI, a/v hallucinations. Pt refused am scheduled Effexor. He is calm and cooperative with staff. Pt visible on unit and focused on discharging. Will continue to monitor.

## 2016-12-19 NOTE — Plan of Care (Signed)
Problem: Safety: Goal: Ability to remain free from injury will improve Outcome: Progressing Pt remains safe while in hospital injury free.    

## 2016-12-19 NOTE — Progress Notes (Signed)
Recreation Therapy Notes  Date: 08.06.18 Time: 1:00 pm Location: Craft Room  Group Topic: Wellness  Goal Area(s) Addresses:  Patient will identify at least one item per dimension of health. Patient will examine areas they are deficient in.  Behavioral Response: Attentive, Interactive  Intervention: 6 Dimensions of Health  Activity: Patients were given a definition sheet defining each dimension of health. Patients were given a worksheet with each dimension listed and were instructed to write items they were currently doing in each dimension.  Education: LRT educated patients on ways to improve each dimension.  Education Outcome: Acknowledges education/In group clarification offered   Clinical Observations/Feedback: Patient wrote at least one item in each dimension. Patient contributed to group discussion by stating what areas he was not giving enough attention to, how he can improve certain dimension, and what would change for him if he was more aware of his wellness.  Jacquelynn CreeGreene,Recardo Linn M, LRT/CTRS 12/19/2016 2:05 PM
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
## 2016-12-19 NOTE — [facility] Group Notes (Signed)
[facility] LCSW Group Therapy Note  Date/Time: 12/19/16, 0930  Type of Therapy and Topic:  Group Therapy:  Overcoming Obstacles  Participation Level:  active  Description of Group:    In this group patients will be encouraged to explore what they see as obstacles to their own wellness and recovery. They will be guided to discuss their thoughts, feelings, and behaviors related to these obstacles. The group will process together ways to cope with barriers, with attention given to specific choices patients can make. Each patient will be challenged to identify changes they are motivated to make in order to overcome their obstacles. This group will be process-oriented, with patients participating in exploration of their own experiences as well as giving and receiving support and challenge from other group members.  Therapeutic Goals: 1. Patient will identify personal and current obstacles as they relate to admission. 2. Patient will identify barriers that currently interfere with their wellness or overcoming obstacles.  3. Patient will identify feelings, thought process and behaviors related to these barriers. 4. Patient will identify two changes they are willing to make to overcome these obstacles:    Summary of Patient Progress: Pt shared that a recent relapse led to his admission and that substance use/addiction has been a longer term obstacle in his life as well.  Pt reports 5 years of sobriety but he has never had formal treatment.  He has been living with someone actively using substances and he realizes that this will not work for him to maintain sobriety.  Good participation.      Therapeutic Modalities:   Cognitive Behavioral Therapy Solution Focused Therapy Motivational Interviewing Relapse Prevention Therapy  Daleen SquibbGreg Kyrollos Cordell, LCSW

## 2016-12-20 MED ORDER — TRAZODONE HCL 50 MG PO TABS: 50.0000 mg | ORAL_TABLET | Freq: Every day | ORAL | 0 refills | Status: AC

## 2016-12-20 MED ORDER — BUPROPION HCL ER (SR) 100 MG PO TB12: 100.0000 mg | ORAL_TABLET | Freq: Two times a day (BID) | ORAL | 0 refills | Status: AC

## 2016-12-20 MED ORDER — TRAZODONE HCL 100 MG PO TABS
100.0000 mg | ORAL_TABLET | Freq: Every day | ORAL | Status: DC
  Administered 2016-12-20: 100 mg via ORAL
  Filled 2016-12-20: qty 1

## 2016-12-20 NOTE — Tx Team (Signed)
Interdisciplinary Treatment and Diagnostic Plan Update *Late Entry  12/20/2016 Time of Session: 11:45AM [name] MRN: [id number]  Principal Diagnosis: MDD (major depressive disorder)  Secondary Diagnoses: Principal Problem:   MDD (major depressive disorder) Active Problems:   Generalized anxiety disorder   Tobacco use disorder   Current Medications:  Current Facility-Administered Medications  Medication Dose Route Frequency Provider Last Rate Last Dose  . acetaminophen (TYLENOL) tablet 650 mg  650 mg Oral Q6H PRN Jimmy Footman, MD      . alum & mag hydroxide-simeth (MAALOX/MYLANTA) 200-200-20 MG/5ML suspension 30 mL  30 mL Oral Q4H PRN Jimmy Footman, MD      . buPROPion [facility] SR) 12 hr tablet 100 mg  100 mg Oral BID Jimmy Footman, MD   100 mg at 12/20/16 0829  . magnesium hydroxide (MILK OF MAGNESIA) suspension 30 mL  30 mL Oral Daily PRN Jimmy Footman, MD      . nicotine (NICODERM CQ - dosed in mg/24 hours) patch 14 mg  14 mg Transdermal Daily Jimmy Footman, MD   14 mg at 12/20/16 4098  . traZODone (DESYREL) tablet 50 mg  50 mg Oral QHS Jimmy Footman, MD   50 mg at 12/19/16 2118   PTA Medications: No prescriptions prior to admission.    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   Physician Treatment Plan for Primary Diagnosis: MDD (major depressive disorder) Long Term Goal(s): Improvement in symptoms so as ready for discharge Improvement in symptoms so as ready for discharge   Short Term Goals: Ability to identify changes in lifestyle to reduce recurrence of condition will improve Ability to verbalize feelings will improve Ability to disclose and discuss  suicidal ideas Ability to demonstrate self-control will improve Ability to identify and develop effective coping behaviors will improve Ability to maintain clinical measurements within normal limits will improve Compliance with prescribed medications will improve Ability to identify triggers associated with substance abuse/mental health issues will improve Ability to identify changes in lifestyle to reduce recurrence of condition will improve Ability to verbalize feelings will improve Ability to disclose and discuss suicidal ideas Ability to demonstrate self-control will improve Ability to identify and develop effective coping behaviors will improve Ability to maintain clinical measurements within normal limits will improve Compliance with prescribed medications will improve Ability to identify triggers associated with substance abuse/mental health issues will improve  Medication Management: Evaluate patient's response, side effects, and tolerance of medication regimen.  Therapeutic Interventions: 1 to 1 sessions, Unit Group sessions and Medication administration.  Evaluation of Outcomes: Progressing  Physician Treatment Plan for Secondary Diagnosis: Principal Problem:   MDD (major depressive disorder) Active Problems:   Generalized anxiety disorder   Tobacco use disorder  Long Term Goal(s): Improvement in symptoms so as ready for discharge Improvement in symptoms so as ready for discharge   Short Term Goals: Ability to identify changes in lifestyle to reduce recurrence of condition will improve Ability to verbalize feelings will improve Ability to disclose and discuss suicidal ideas Ability to demonstrate self-control will improve Ability to identify and develop effective coping behaviors will improve Ability to maintain clinical measurements within normal limits will improve Compliance with prescribed medications will improve Ability to identify triggers associated with  substance abuse/mental health issues will improve Ability to identify changes in lifestyle to reduce recurrence of condition will improve  Ability to verbalize feelings will improve Ability to disclose and discuss suicidal ideas Ability to demonstrate self-control will improve Ability to identify and develop effective coping behaviors will improve Ability to maintain clinical measurements within normal limits will improve Compliance with prescribed medications will improve Ability to identify triggers associated with substance abuse/mental health issues will improve     Medication Management: Evaluate patient's response, side effects, and tolerance of medication regimen.  Therapeutic Interventions: 1 to 1 sessions, Unit Group sessions and Medication administration.  Evaluation of Outcomes: Progressing   RN Treatment Plan for Primary Diagnosis: MDD (major depressive disorder) Long Term Goal(s): Knowledge of disease and therapeutic regimen to maintain health will improve  Short Term Goals: Ability to demonstrate self-control, Ability to participate in decision making will improve and Ability to identify and develop effective coping behaviors will improve  Medication Management: RN will administer medications as ordered by provider, will assess and evaluate patient's response and provide education to patient for prescribed medication. RN will report any adverse and/or side effects to prescribing provider.  Therapeutic Interventions: 1 on 1 counseling sessions, Psychoeducation, Medication administration, Evaluate responses to treatment, Monitor vital signs and CBGs as ordered, Perform/monitor CIWA, COWS, AIMS and Fall Risk screenings as ordered, Perform wound care treatments as ordered.  Evaluation of Outcomes: Progressing   LCSW Treatment Plan for Primary Diagnosis: MDD (major depressive disorder) Long Term Goal(s): Safe transition to appropriate next level of care at discharge, Engage  patient in therapeutic group addressing interpersonal concerns.  Short Term Goals: Engage patient in aftercare planning with referrals and resources, Increase social support and Identify triggers associated with mental health/substance abuse issues  Therapeutic Interventions: Assess for all discharge needs, 1 to 1 time with Social worker, Explore available resources and support systems, Assess for adequacy in community support network, Educate family and significant other(s) on suicide prevention, Complete Psychosocial Assessment, Interpersonal group therapy.  Evaluation of Outcomes: Progressing   Progress in Treatment: Attending groups: Yes. Participating in groups: Yes. Taking medication as prescribed: Yes. Toleration medication: Yes. Family/Significant other contact made: No, will contact:  Pt refused family contact. Patient understands diagnosis: Yes. Discussing patient identified problems/goals with staff: Yes. Medical problems stabilized or resolved: Yes. Denies suicidal/homicidal ideation: Yes. Issues/concerns per patient self-inventory: No.  New problem(s) identified: No, Describe:  None  New Short Term/Long Term Goal(s): Patient stated that his goal is to go to residential substance abuse facility for a few months.  Discharge Plan or Barriers: CSW assessing appropriate discharge plan.  Reason for Continuation of Hospitalization: Anxiety Depression Suicidal ideation  Estimated Length of Stay: 2 days   Attendees: Patient: Ethan LarocheJoshua Carie  12/20/2016 8:41 AM  Physician: Dr. Radene JourneyAndrea Hernandez, MD  12/20/2016 8:41 AM  Nursing:  12/20/2016 8:41 AM  RN Care Manager: 12/20/2016 8:41 AM  Social Worker: Hampton AbbotKadijah Germain Koopmann, MSW, LCSW-A 12/20/2016 8:41 AM  Recreational Therapist: Princella IonElizabeth Greene, LRT, CTRS  12/20/2016 8:41 AM  Other:  12/20/2016 8:41 AM  Other:  12/20/2016 8:41 AM  Other: 12/20/2016 8:41 AM    Scribe for Treatment Team: Lynden OxfordKadijah R Averyana Pillars, LCSWA 12/20/2016 8:41 AM

## 2016-12-20 NOTE — [facility] Suicide Risk Assessment (Signed)
[facility]   Principal Problem: MDD (major depressive disorder) Discharge Diagnoses:  Patient Active Problem List   Diagnosis Date Noted  . Tobacco use disorder [F17.200] 12/19/2016  . MDD (major depressive disorder) [F32.9] 12/17/2016  . Generalized anxiety disorder [F41.1]      Psychiatric Specialty Exam: ROS  Blood pressure 123/82, pulse 96, temperature 98.7 F (37.1 C), temperature source Oral, resp. rate 18, height 6' (1.829 m), weight 64.4 kg (142 lb), SpO2 99 %.Body mass index is 19.26 kg/m.                                                       Mental Status Per Nursing Assessment::   On Admission:     Demographic Factors:  Male, Adolescent or young adult, Caucasian and Unemployed  Loss Factors: Financial problems/change in socioeconomic status  Historical Factors: Impulsivity  Risk Reduction Factors:   Sense of responsibility to family and Positive social support  Continued Clinical Symptoms:  Alcohol/Substance Abuse/Dependencies Previous Psychiatric Diagnoses and Treatments  Cognitive Features That Contribute To Risk:  None    Suicide Risk:  Minimal: No identifiable suicidal ideation.  Patients presenting with no risk factors but with morbid ruminations; may be classified as minimal risk based on the severity of the depressive symptoms  Follow-up Information    Services, [facility]. Go on 12/21/2016.   Why:  Please follow-up [facility] on August 8th at 7:45 AM. It is important to be there on time for your initial assessment. Please bring jacket, sweatshirt, 30 day medication supply, hard candy/gum, and discharge paperwork. Contact information: [address] [place] [id number] [phone]            Jimmy FootmanHernandez-Gonzalez,  Beaumont Austad, MD 12/20/2016, 11:55 AM

## 2016-12-20 NOTE — Plan of Care (Signed)
Problem: Health Behavior/Discharge Planning: Goal: Identification of resources available to assist in meeting health care needs will improve Outcome: Progressing Aware  Of discharge  Tomorrow

## 2016-12-20 NOTE — [facility] Group Notes (Signed)
[facility] Group Notes:  (Nursing/MHT/Case Management/Adjunct)  Date:  12/20/2016  Time:  3:04 PM  Type of Therapy:  Psychoeducational Skills  Participation Level:  Active  Participation Quality:  Appropriate, Attentive and Sharing  Affect:  Appropriate  Cognitive:  Alert and Appropriate  Insight:  Appropriate  Engagement in Group:  Engaged  Modes of Intervention:  Discussion, Education and Support  Summary of Progress/Problems:  Ethan SmokeCara Forbes Ethan Forbes 12/20/2016, 3:04 PM
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
## 2016-12-20 NOTE — Plan of Care (Signed)
Problem: Activity: Goal: Interest or engagement in activities will improve Outcome: Progressing  Attending unit  Programing  Goal: Sleeping patterns will improve Outcome: Progressing Voice no concerns around sleep  Problem: Coping: Goal: Ability to verbalize frustrations and anger appropriately will improve Outcome: Progressing Attending unit programing  Verbalize  Feeling  Goal: Ability to demonstrate self-control will improve Outcome: Progressing No anger out burst , remain in control  Problem: Safety: Goal: Periods of time without injury will increase No injuries during this admission

## 2016-12-20 NOTE — Progress Notes (Signed)
D: Affect cheerful on  Approach. Interacting with peers and staff . appetite good . Patient aware of discharge  tomorrow . Patient denies suicidal Ideations. Denies depression  D: Patient stated slept fair last night .Stated appetite is good and energy level  normal. Stated concentration good . Stated on Depression scale , hopeless and anxiety .( low 0-10 high) Denies suicidal  homicidal ideations  .  No auditory hallucinations  No pain concerns . Appropriate ADL'S. Interacting with peers and staff.  A: Encourage patient participation with unit programming . Instruction  Given on  Medication , verbalize understanding. R: Voice no other concerns. Staff continue to monitor

## 2016-12-20 NOTE — [facility] Group Notes (Signed)
[facility] LCSW Group Therapy Note  Date/Time:12/20/2016, 9:30am  Type of Therapy/Topic:  Group Therapy:  Feelings about Diagnosis  Participation Level:  Active   Mood: OKAY   Description of Group:    This group will allow patients to explore their thoughts and feelings about diagnoses they have received. Patients will be guided to explore their level of understanding and acceptance of these diagnoses. Facilitator will encourage patients to process their thoughts and feelings about the reactions of others to their diagnosis, and will guide patients in identifying ways to discuss their diagnosis with significant others in their lives. This group will be process-oriented, with patients participating in exploration of their own experiences as well as giving and receiving support and challenge from other group members.   Therapeutic Goals: 1. Patient will demonstrate understanding of diagnosis as evidence by identifying two or more symptoms of the disorder:  2. Patient will be able to express two feelings regarding the diagnosis 3. Patient will demonstrate ability to communicate their needs through discussion and/or role plays  Summary of Patient Progres Patient able to meet the above therapeutic goals.    Therapeutic Modalities:   Cognitive Behavioral Therapy Brief Therapy Feelings Identification    Glennon MacSara P Lupie Sawa, LCSW 12/20/2016, 3:43 PM
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
## 2016-12-20 NOTE — [facility] Group Notes (Signed)
[facility] Group Notes:  (Nursing/MHT/Case Management/Adjunct)  Date:  12/20/2016  Time:  7:14 AM  Type of Therapy:  Group Therapy  Participation Level:  Did Not Attend    Marlayna Bannister W Keron Koffman 12/20/2016, 7:14 AM 
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
## 2016-12-21 NOTE — Progress Notes (Signed)
Patient denied SI/HI/AVH, discharge instructions and medications including SI Hotlines discussed, understanding verbalized, discharged as arranged.

## 2016-12-21 NOTE — Progress Notes (Signed)
  [facility] :  Will you be returning to the same living situation after discharge:  No. Admitting to residential treatment facility At discharge, do you have transportation home?: Yes, taxi to facility Do you have the ability to pay for your medications: No.  Referred to provider who can assist  Release of information consent forms completed and in the chart;  Patient's signature needed at discharge.  Patient to Follow up at: Follow-up Information    Services, [facility]. Go on 12/21/2016.   Why:  Please follow-up [facility] on August 8th at 7:45 AM. It is important to be there on time for your initial assessment. Please bring jacket, sweatshirt, 30 day medication supply, hard candy/gum, and discharge paperwork. Contact information: [address] [place] [address] [phone]           Next level of care provider [facility] Link:no    Safety Planning and Suicide Prevention discussed: Yes,  reviewed w patient, patient refused consent for collateral contact  Have you used any form of tobacco in the last 30 days? (Cigarettes, Smokeless Tobacco, Cigars, and/or Pipes): Yes  Has patient been referred to the Quitline?: Patient refused referral  Patient has been referred for addiction treatment: Yes  Sallee Langenne C Lucile Hillmann, LCSW 12/21/2016, 5:39 PM

## 2016-12-21 NOTE — Plan of Care (Signed)
Problem: Activity: Goal: Sleeping patterns will improve Outcome: Progressing Patient slept for Estimated Hours of 8.0; Precautionary checks every 15 minutes for safety maintained, room free of safety hazards, patient sustains no injury or falls during this shift.    

## 2017-05-17 IMAGING — CT CT RENAL STONE PROTOCOL
2 of 3 series · 16 of 46 positions shown, 18 images · non-contrast
Comparison: None.

CLINICAL DATA: Right flank pain.  History kidney stones.

EXAM:
CT ABDOMEN AND PELVIS WITHOUT CONTRAST
TECHNIQUE: Multidetector CT imaging of the abdomen and pelvis was performed
following the standard protocol without IV contrast.

[Series 3: coronal · coronal · 0.74mm/px · 3 of 126 slices shown]
[im 42/126  soft-tissue]
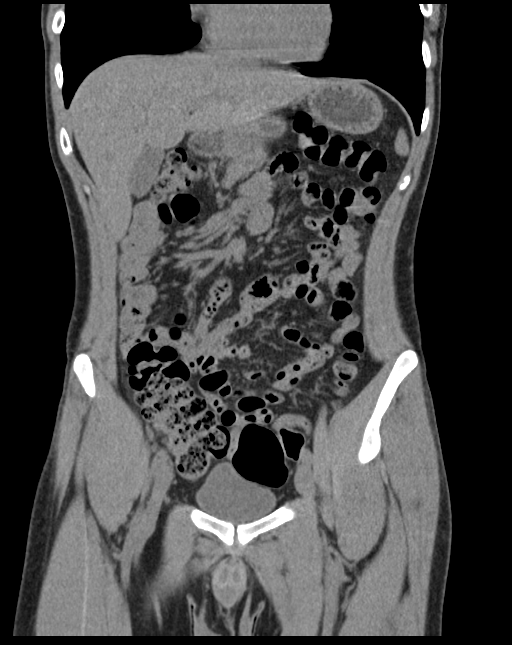
[im 56/126  soft-tissue]
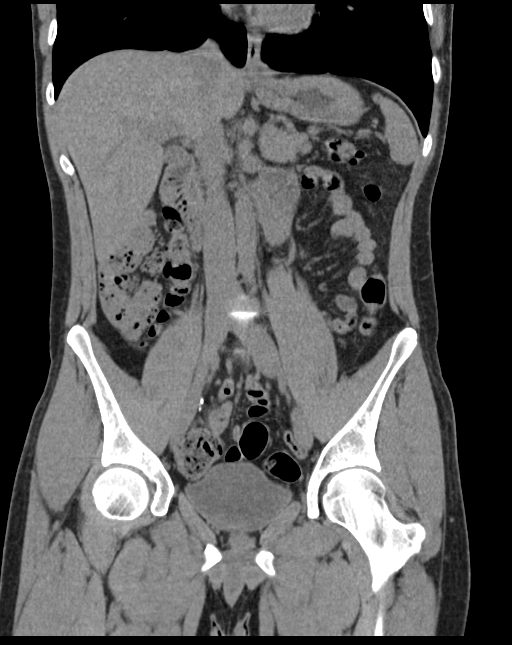
[im 70/126  soft-tissue]
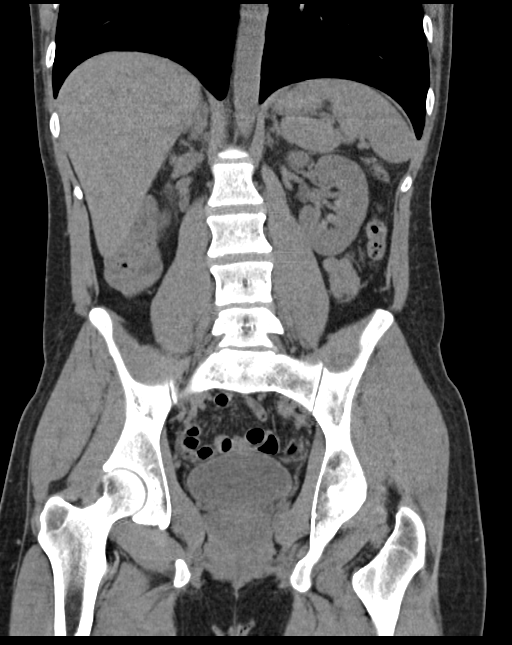

[Series 6: lung · axial · 0.76mm/px · z∈[-91,-1]mm · 13 of 53 slices shown, 15 images]
[im 4/53  soft-tissue]
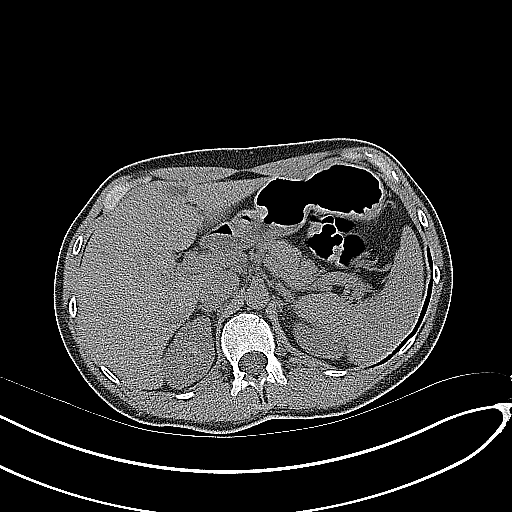
[im 4/53  bone]
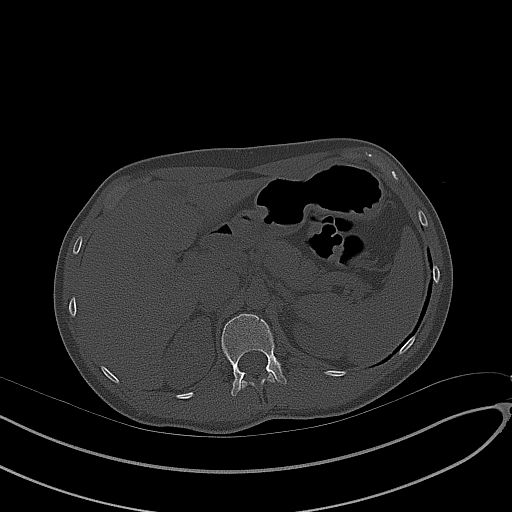
[im 7/53  soft-tissue]
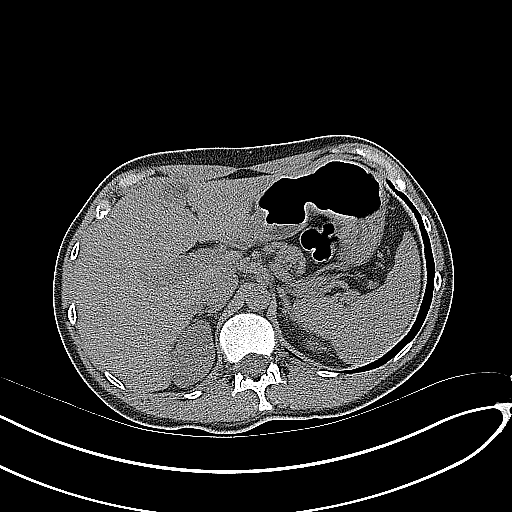
[im 11/53  soft-tissue]
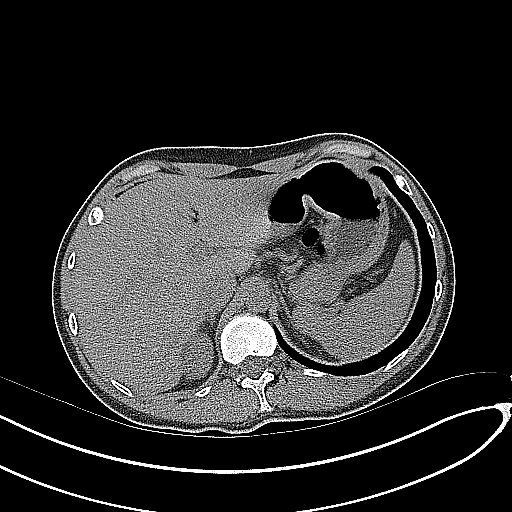
[im 16/53  soft-tissue]
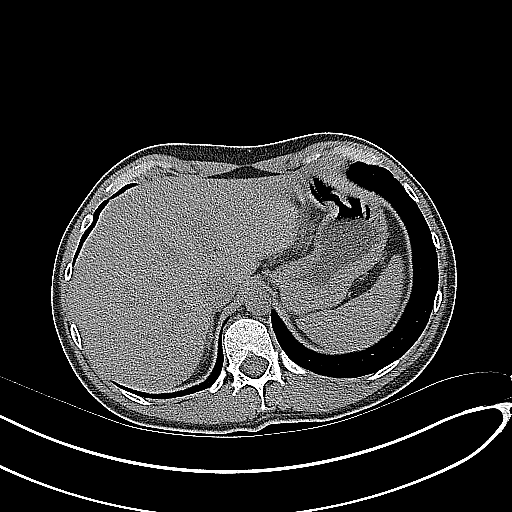
[im 19/53  soft-tissue]
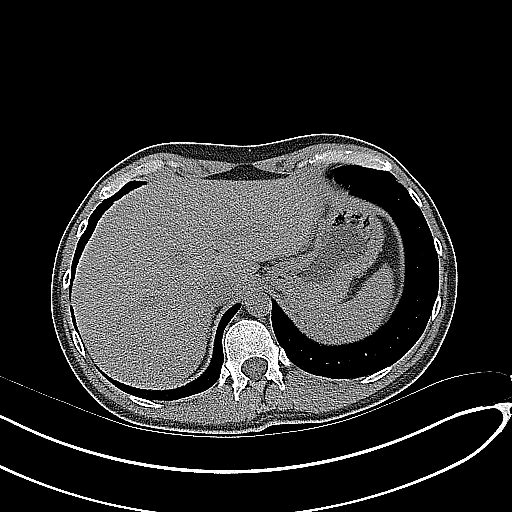
[im 22/53  soft-tissue]
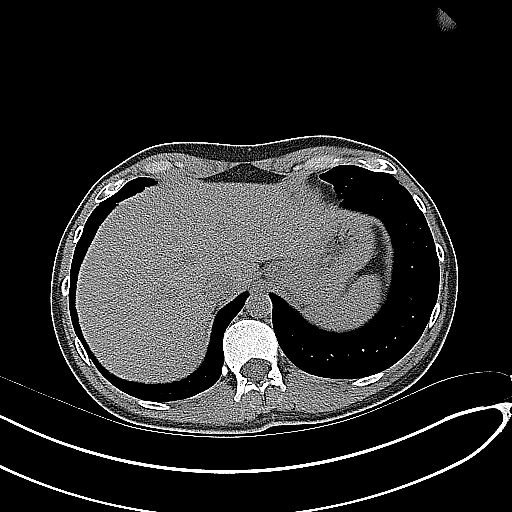
[im 27/53  soft-tissue]
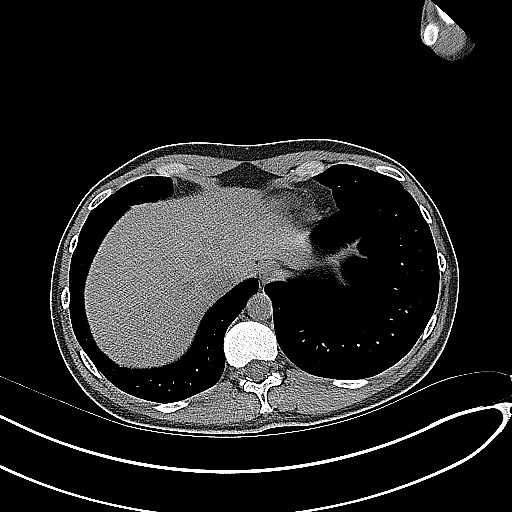
[im 31/53  soft-tissue]
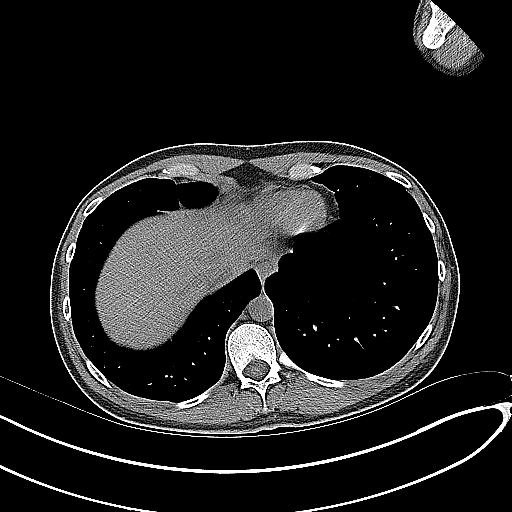
[im 34/53  soft-tissue]
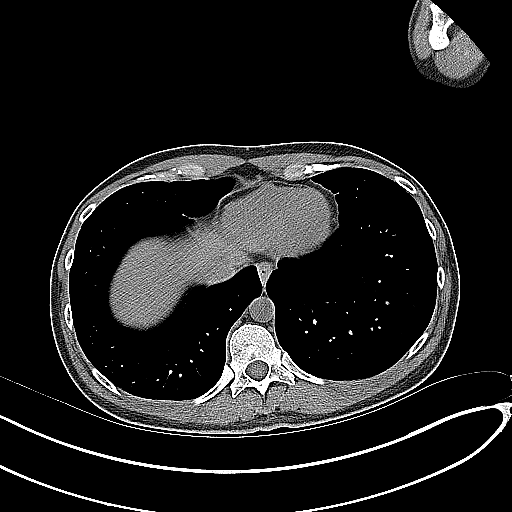
[im 34/53  bone]
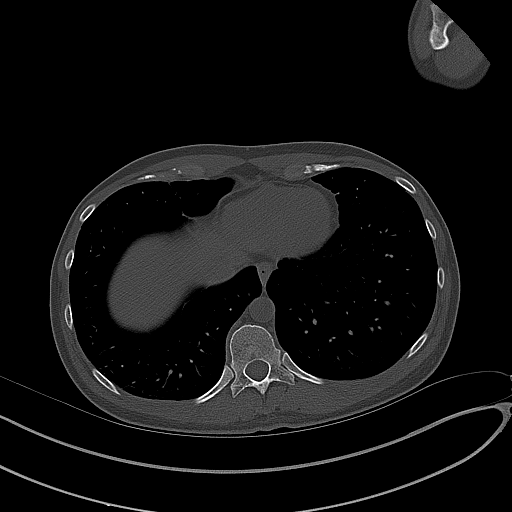
[im 37/53  soft-tissue]
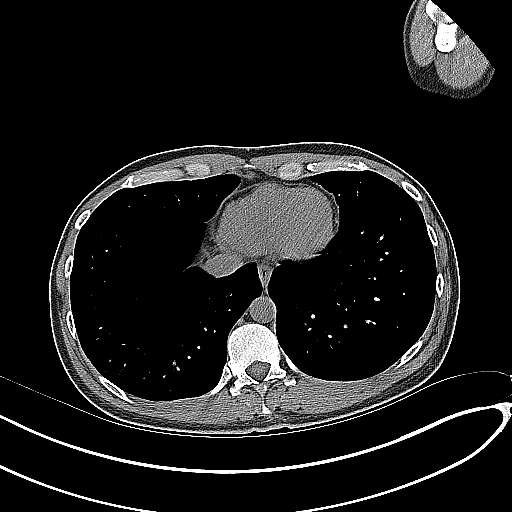
[im 42/53  soft-tissue]
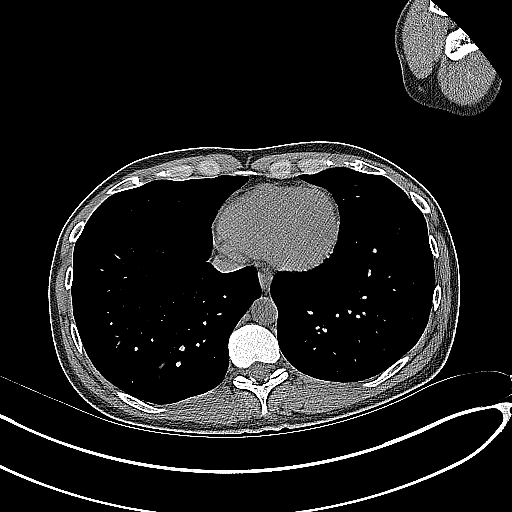
[im 46/53  soft-tissue]
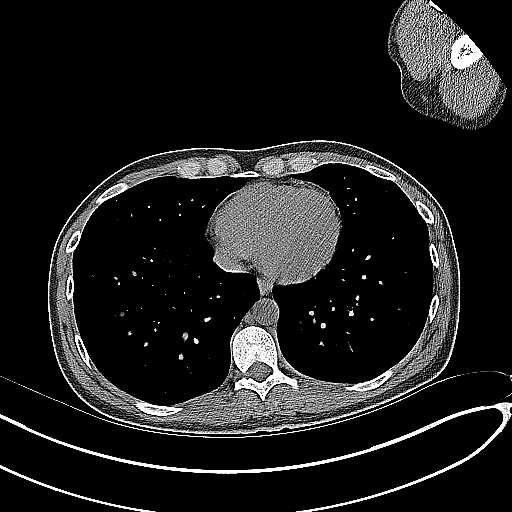
[im 49/53  soft-tissue]
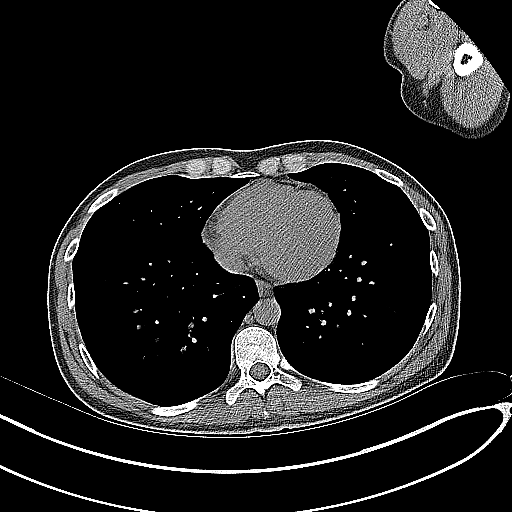

[16 of 46 positions shown; findings below may reference images not displayed]

FINDINGS: Lower chest: A few scattered nodules are seen in the left lung base
such as peripherally on series 6, image 25 and a cluster of nodules
on series 6, images 9 and 10. The largest nodule on image 9 measures
4 mm. No other abnormalities in the lung bases.

Hepatobiliary: Low-attenuation adjacent to the falciform ligament is
likely focal fatty deposition. The liver and gallbladder are
otherwise normal.

Pancreas: Unremarkable. No pancreatic ductal dilatation or
surrounding inflammatory changes.

Spleen: Normal in size without focal abnormality.

Adrenals/Urinary Tract: The adrenal glands are normal. No renal
stones are identified. No hydronephrosis or perinephric stranding.
The ureters are nondilated with no ureteral stones identified. High
attenuation foci in the right pelvis on series 2, images 60 and 64
appear to be separate from the ureter and may be clips from previous
appendectomy.

Stomach/Bowel: The stomach and small bowel are normal. The colon is
unremarkable. The patient may be status post appendectomy. Recommend
clinical correlation. An abnormal appendix is not seen to suggest
appendicitis.

Vascular/Lymphatic: No significant vascular findings are present. No
enlarged abdominal or pelvic lymph nodes.

Reproductive: Prostate is unremarkable.

Other: No abdominal wall hernia or abnormality. No abdominopelvic
ascites.

Musculoskeletal: No acute or significant osseous findings.
IMPRESSION: 1. No cause for the patient's right flank pain identified. No renal
or ureteral stones.
2. Scatter nodules in the left lung base with the largest measuring
4 mm. No follow-up needed if patient is low-risk (and has no known
or suspected primary neoplasm). Non-contrast chest CT can be
considered in 12 months if patient is high-risk. This recommendation
follows the consensus statement: Guidelines for Management of
Incidental Pulmonary Nodules Detected on CT Images: From the
